# Patient Record
Sex: Female | Born: 1950 | Race: White | Hispanic: No | Marital: Married | Smoking: Never smoker
Health system: Southern US, Community
[De-identification: ages and names within clinical notes are randomized; demographics above are authoritative.]

## PROBLEM LIST (undated history)

## (undated) DIAGNOSIS — K219 Gastro-esophageal reflux disease without esophagitis: Secondary | ICD-10-CM

## (undated) DIAGNOSIS — J42 Unspecified chronic bronchitis: Secondary | ICD-10-CM

## (undated) HISTORY — PX: ABDOMINAL HYSTERECTOMY: SHX81

---

## 2016-05-11 ENCOUNTER — Inpatient Hospital Stay (HOSPITAL_BASED_OUTPATIENT_CLINIC_OR_DEPARTMENT_OTHER)
Admission: EM | Admit: 2016-05-11 | Discharge: 2016-05-12 | DRG: 378 | Disposition: A | Discharge status: Home / Self Care | Payer: Medicare HMO | Attending: Internal Medicine | Admitting: Internal Medicine

## 2016-05-11 ENCOUNTER — Encounter (HOSPITAL_BASED_OUTPATIENT_CLINIC_OR_DEPARTMENT_OTHER): Payer: Self-pay | Admitting: *Deleted

## 2016-05-11 ENCOUNTER — Emergency Department (HOSPITAL_BASED_OUTPATIENT_CLINIC_OR_DEPARTMENT_OTHER): Payer: Medicare HMO

## 2016-05-11 DIAGNOSIS — K219 Gastro-esophageal reflux disease without esophagitis: Secondary | ICD-10-CM | POA: Diagnosis present

## 2016-05-11 DIAGNOSIS — K921 Melena: Secondary | ICD-10-CM

## 2016-05-11 DIAGNOSIS — K2970 Gastritis, unspecified, without bleeding: Secondary | ICD-10-CM | POA: Diagnosis present

## 2016-05-11 DIAGNOSIS — Z90710 Acquired absence of both cervix and uterus: Secondary | ICD-10-CM | POA: Diagnosis not present

## 2016-05-11 DIAGNOSIS — Z806 Family history of leukemia: Secondary | ICD-10-CM | POA: Diagnosis not present

## 2016-05-11 DIAGNOSIS — E538 Deficiency of other specified B group vitamins: Secondary | ICD-10-CM

## 2016-05-11 DIAGNOSIS — R1310 Dysphagia, unspecified: Secondary | ICD-10-CM | POA: Diagnosis present

## 2016-05-11 DIAGNOSIS — K259 Gastric ulcer, unspecified as acute or chronic, without hemorrhage or perforation: Secondary | ICD-10-CM | POA: Diagnosis present

## 2016-05-11 DIAGNOSIS — K210 Gastro-esophageal reflux disease with esophagitis: Secondary | ICD-10-CM | POA: Diagnosis present

## 2016-05-11 DIAGNOSIS — D62 Acute posthemorrhagic anemia: Secondary | ICD-10-CM | POA: Diagnosis present

## 2016-05-11 DIAGNOSIS — J42 Unspecified chronic bronchitis: Secondary | ICD-10-CM | POA: Diagnosis present

## 2016-05-11 DIAGNOSIS — D539 Nutritional anemia, unspecified: Secondary | ICD-10-CM | POA: Diagnosis present

## 2016-05-11 DIAGNOSIS — K208 Other esophagitis without bleeding: Secondary | ICD-10-CM

## 2016-05-11 DIAGNOSIS — Z8249 Family history of ischemic heart disease and other diseases of the circulatory system: Secondary | ICD-10-CM

## 2016-05-11 DIAGNOSIS — K449 Diaphragmatic hernia without obstruction or gangrene: Secondary | ICD-10-CM | POA: Diagnosis present

## 2016-05-11 HISTORY — DX: Gastro-esophageal reflux disease without esophagitis: K21.9

## 2016-05-11 HISTORY — DX: Unspecified chronic bronchitis: J42

## 2016-05-11 LAB — CBC WITH DIFFERENTIAL/PLATELET
BASOS PCT: 0 %
Basophils Absolute: 0 10*3/uL (ref 0.0–0.1)
EOS ABS: 0 10*3/uL (ref 0.0–0.7)
EOS PCT: 0 %
HCT: 20.3 % — ABNORMAL LOW (ref 36.0–46.0)
HEMOGLOBIN: 6.5 g/dL — AB (ref 12.0–15.0)
Lymphocytes Relative: 20 %
Lymphs Abs: 2.5 10*3/uL (ref 0.7–4.0)
MCH: 32.2 pg (ref 26.0–34.0)
MCHC: 32 g/dL (ref 30.0–36.0)
MCV: 100.5 fL — ABNORMAL HIGH (ref 78.0–100.0)
MYELOCYTES: 1 %
Metamyelocytes Relative: 1 %
Monocytes Absolute: 0 10*3/uL — ABNORMAL LOW (ref 0.1–1.0)
Monocytes Relative: 0 %
NEUTROS ABS: 10.1 10*3/uL — AB (ref 1.7–7.7)
NEUTROS PCT: 78 %
Platelets: 285 10*3/uL (ref 150–400)
RBC: 2.02 MIL/uL — ABNORMAL LOW (ref 3.87–5.11)
RDW: 14.7 % (ref 11.5–15.5)
WBC: 12.6 10*3/uL — ABNORMAL HIGH (ref 4.0–10.5)

## 2016-05-11 LAB — COMPREHENSIVE METABOLIC PANEL
ALK PHOS: 72 U/L (ref 38–126)
ALT: 13 U/L — AB (ref 14–54)
AST: 21 U/L (ref 15–41)
Albumin: 3.5 g/dL (ref 3.5–5.0)
Anion gap: 9 (ref 5–15)
BUN: 38 mg/dL — AB (ref 6–20)
CALCIUM: 8.7 mg/dL — AB (ref 8.9–10.3)
CO2: 22 mmol/L (ref 22–32)
CREATININE: 0.83 mg/dL (ref 0.44–1.00)
Chloride: 106 mmol/L (ref 101–111)
GFR calc non Af Amer: >60 mL/min (ref >60)
GLUCOSE: 135 mg/dL — AB (ref 65–99)
Potassium: 3.6 mmol/L (ref 3.5–5.1)
SODIUM: 137 mmol/L (ref 135–145)
Total Bilirubin: 0.3 mg/dL (ref 0.3–1.2)
Total Protein: 6.6 g/dL (ref 6.5–8.1)

## 2016-05-11 LAB — RETICULOCYTES
RBC.: 1.79 MIL/uL — AB (ref 3.87–5.11)
RETIC COUNT ABSOLUTE: 139.6 10*3/uL (ref 19.0–186.0)
Retic Ct Pct: 7.8 % — ABNORMAL HIGH (ref 0.4–3.1)

## 2016-05-11 LAB — PROTIME-INR
INR: 1.08
Prothrombin Time: 14 seconds (ref 11.4–15.2)

## 2016-05-11 LAB — FOLATE: FOLATE: 14.3 ng/mL (ref >5.9)

## 2016-05-11 LAB — HEMOGLOBIN AND HEMATOCRIT, BLOOD
HCT: 19 % — ABNORMAL LOW (ref 36.0–46.0)
Hemoglobin: 6.3 g/dL — CL (ref 12.0–15.0)

## 2016-05-11 LAB — IRON AND TIBC
IRON: 29 ug/dL (ref 28–170)
Saturation Ratios: 11 % (ref 10.4–31.8)
TIBC: 276 ug/dL (ref 250–450)
UIBC: 247 ug/dL

## 2016-05-11 LAB — FERRITIN: Ferritin: 20 ng/mL (ref 11–307)

## 2016-05-11 LAB — TROPONIN I: Troponin I: <0.03 ng/mL (ref <0.03)

## 2016-05-11 LAB — MRSA PCR SCREENING: MRSA by PCR: NEGATIVE

## 2016-05-11 LAB — ABO/RH: ABO/RH(D): O NEG

## 2016-05-11 LAB — PREPARE RBC (CROSSMATCH)

## 2016-05-11 LAB — VITAMIN B12: Vitamin B-12: 163 pg/mL — ABNORMAL LOW (ref 180–914)

## 2016-05-11 LAB — OCCULT BLOOD X 1 CARD TO LAB, STOOL: Fecal Occult Bld: POSITIVE — AB

## 2016-05-11 MED ORDER — PANTOPRAZOLE SODIUM 40 MG IV SOLR
INTRAVENOUS | Status: AC
  Filled 2016-05-11: qty 80

## 2016-05-11 MED ORDER — ACETAMINOPHEN 650 MG RE SUPP: 650.0000 mg | Freq: Four times a day (QID) | RECTAL | Status: DC | PRN

## 2016-05-11 MED ORDER — ONDANSETRON HCL 4 MG PO TABS: 4.0000 mg | ORAL_TABLET | Freq: Four times a day (QID) | ORAL | Status: DC | PRN

## 2016-05-11 MED ORDER — SODIUM CHLORIDE 0.9 % IV SOLN
Freq: Once | INTRAVENOUS | Status: AC
  Administered 2016-05-11: 17:00:00 via INTRAVENOUS

## 2016-05-11 MED ORDER — SODIUM CHLORIDE 0.9 % IV BOLUS (SEPSIS)
1000.0000 mL | Freq: Once | INTRAVENOUS | Status: AC
  Administered 2016-05-11: 1000 mL via INTRAVENOUS

## 2016-05-11 MED ORDER — SODIUM CHLORIDE 0.9 % IV SOLN
8.0000 mg/h | INTRAVENOUS | Status: DC
  Administered 2016-05-11: 8 mg/h via INTRAVENOUS
  Filled 2016-05-11 (×4): qty 80

## 2016-05-11 MED ORDER — ONDANSETRON HCL 4 MG/2ML IJ SOLN: 4.0000 mg | Freq: Four times a day (QID) | INTRAMUSCULAR | Status: DC | PRN

## 2016-05-11 MED ORDER — SODIUM CHLORIDE 0.9 % IV SOLN
80.0000 mg | Freq: Once | INTRAVENOUS | Status: AC
  Administered 2016-05-11: 80 mg via INTRAVENOUS
  Filled 2016-05-11: qty 80

## 2016-05-11 MED ORDER — SODIUM CHLORIDE 0.9 % IV SOLN
INTRAVENOUS | Status: DC
  Administered 2016-05-11 – 2016-05-12 (×2): via INTRAVENOUS

## 2016-05-11 MED ORDER — FLUTICASONE PROPIONATE 50 MCG/ACT NA SUSP
1.0000 | Freq: Every day | NASAL | Status: DC
  Administered 2016-05-11: 1 via NASAL
  Filled 2016-05-11: qty 16

## 2016-05-11 MED ORDER — SODIUM CHLORIDE 0.9% FLUSH
3.0000 mL | Freq: Two times a day (BID) | INTRAVENOUS | Status: DC
  Administered 2016-05-11 – 2016-05-12 (×2): 3 mL via INTRAVENOUS

## 2016-05-11 MED ORDER — ACETAMINOPHEN 325 MG PO TABS: 650.0000 mg | ORAL_TABLET | Freq: Four times a day (QID) | ORAL | Status: DC | PRN

## 2016-05-11 MED ORDER — LORATADINE 10 MG PO TABS
10.0000 mg | ORAL_TABLET | Freq: Every day | ORAL | Status: DC
  Filled 2016-05-11: qty 1

## 2016-05-11 NOTE — ED Triage Notes (Signed)
She noticed her stools were black after starting  Augmentin on Thursday for treatment of bronchitis. She is pale, dizzy, and weak. She fell due to dizziness this am.

## 2016-05-11 NOTE — ED Notes (Signed)
[facility] here to transport pt to [facility]. Care turned over to transport team.

## 2016-05-11 NOTE — Plan of Care (Signed)
Called by Dr Silverio LayYao [facility] presented with melanotic stools. Patient had started Augmentin for bronchitis and subsequently noticed melanotic stools. Today felt dizzy and near syncopal. Hemoglobin 6.5.  Dr Silverio LayYao [facility]. Accepted to stepdown unit.   Carmen Williams M.D. [facility] 05/11/2016, 1:26 PM  Pager: [phone]

## 2016-05-11 NOTE — ED Notes (Signed)
Dr Silverio LayYao aware of orthostatic VS.

## 2016-05-11 NOTE — ED Provider Notes (Addendum)
[facility] Provider Note   CSN: [id number] Arrival date & time: 05/11/16  1041     History   Chief Complaint Chief Complaint  Patient presents with  . GI Bleeding    HPI Carmen Williams is a 65 y.o. female history of hysterectomy here presenting with cough, melena. Patient has been coughing for the last 10 days or so. Patient was initially prescribed a Z-Pak but it didn't help and she was still coughing so was prescribed a course of Augmentin 5 days ago. Patient states that the coughing has improved but for the last 4-5 days, she noticed black stools. Denies any vomiting or abdominal pain. Today she felt lightheaded and dizzy and almost passed out in the shower. Patient denies chest pain or shortness of breath. Not on blood thinners and no hx of GI bleed.   The history is provided by the patient.    History reviewed. No pertinent past medical history.  There are no active problems to display for this patient.   Past Surgical History:  Procedure Laterality Date  . ABDOMINAL HYSTERECTOMY      OB History    No data available       Home Medications    Prior to Admission medications   Not on File    Family History No family history on file.  Social History Social History  Substance Use Topics  . Smoking status: Never Smoker  . Smokeless tobacco: Never Used  . Alcohol use No     Allergies   Patient has no known allergies.   Review of Systems Review of Systems  Gastrointestinal: Positive for blood in stool.  Neurological: Positive for dizziness.  All other systems reviewed and are negative.    Physical Exam Updated Vital Signs BP 141/86   Pulse 91   Temp 98.6 F (37 C) (Oral)   Resp 24   Ht 5' (1.524 m)   Wt 179 lb (81.2 kg)   SpO2 99%   BMI 34.96 kg/m   Physical Exam  Constitutional:  Pale   HENT:  Head: Normocephalic.  Eyes: EOM are normal. Pupils are equal, round, and reactive to light.  Conjunctiva pale   Neck: Normal range  of motion.  Cardiovascular: Regular rhythm and normal heart sounds.   Tachycardic   Pulmonary/Chest: Effort normal and breath sounds normal. No respiratory distress. She has no wheezes. She has no rales.  Abdominal: Soft. Bowel sounds are normal. She exhibits no distension. There is no tenderness. There is no guarding.  Genitourinary:  Genitourinary Comments: + melena. No obvious hemorrhoids   Musculoskeletal: Normal range of motion.  Neurological: She is alert.  Skin: Skin is warm.  Psychiatric: She has a normal mood and affect.  Nursing note and vitals reviewed.    ED Treatments / Results  Labs (all labs ordered are listed, but only abnormal results are displayed) Labs Reviewed  CBC WITH DIFFERENTIAL/PLATELET - Abnormal; Notable for the following:       Result Value   WBC 12.6 (*)    RBC 2.02 (*)    Hemoglobin 6.5 (*)    HCT 20.3 (*)    MCV 100.5 (*)    Neutro Abs 10.1 (*)    Monocytes Absolute 0.0 (*)    All other components within normal limits  COMPREHENSIVE METABOLIC PANEL - Abnormal; Notable for the following:    Glucose, Bld 135 (*)    BUN 38 (*)    Calcium 8.7 (*)    ALT  13 (*)    All other components within normal limits  OCCULT BLOOD X 1 CARD TO LAB, STOOL - Abnormal; Notable for the following:    Fecal Occult Bld POSITIVE (*)    All other components within normal limits  PROTIME-INR  TROPONIN I    EKG  EKG Interpretation  Date/Time:  Monday May 11 2016 11:26:47 EST Ventricular Rate:  93 PR Interval:    QRS Duration: 86 QT Interval:  366 QTC Calculation: 456 R Axis:   -10 Text Interpretation:  Sinus rhythm Abnormal R-wave progression, early transition Baseline wander in lead(s) I II III aVR aVL aVF V1 V5 No previous ECGs available Confirmed by Adalie Mand  MD, Gearline Spilman ([id number]) on 05/11/2016 11:53:59 AM       Radiology Dg Chest 2 View  Result Date: 05/11/2016 CLINICAL DATA:  Dry cough for a week. EXAM: CHEST  2 VIEW COMPARISON:  None. FINDINGS: The  heart size and mediastinal contours are within normal limits. Both lungs are clear. The visualized skeletal structures are unremarkable. IMPRESSION: No active cardiopulmonary disease. Electronically Signed   By: Charlett NoseKevin  Dover M.D.   On: 05/11/2016 11:53    Procedures Procedures (including critical care time)  CRITICAL CARE Performed by: Richardean Canalavid H Jolan Upchurch   Total critical care time:30 minutes  Critical care time was exclusive of separately billable procedures and treating other patients.  Critical care was necessary to treat or prevent imminent or life-threatening deterioration.  Critical care was time spent personally by me on the following activities: development of treatment plan with patient and/or surrogate as well as nursing, discussions with consultants, evaluation of patient's response to treatment, examination of patient, obtaining history from patient or surrogate, ordering and performing treatments and interventions, ordering and review of laboratory studies, ordering and review of radiographic studies, pulse oximetry and re-evaluation of patient's condition.   Medications Ordered in ED Medications  pantoprazole (PROTONIX) 80 mg in sodium chloride 0.9 % 250 mL (0.32 mg/mL) infusion (not administered)  pantoprazole (PROTONIX) 40 MG injection (not administered)  pantoprazole (PROTONIX) 40 MG injection (not administered)  sodium chloride 0.9 % bolus 1,000 mL (0 mLs Intravenous Stopped 05/11/16 1220)  pantoprazole (PROTONIX) 80 mg in sodium chloride 0.9 % 100 mL IVPB (80 mg Intravenous New Bag/Given 05/11/16 1242)     Initial Impression / Assessment and Plan / ED Course  I have reviewed the triage vital signs and the nursing notes.  Pertinent labs & imaging results that were available during my care of the patient were reviewed by me and considered in my medical decision making (see chart for details).  Clinical Course    Carmen Williams is a 65 y.o. female here with near syncope from  melena. Has been coughing but no fevers. Patient orthostatic and dropped BP to 70s. No hx of GI bleed and not on blood thinners. Will check labs, coag. Will likely start protonix and give IVF. Has no previous colonoscopy in the past.   12: 30 pm Patient's Hg 6.5. Occ positive. Started on protonix bolus and drip. Requested [facility]. Called Dr. Wynona MealsSowmya but there are no monitored beds. [facility]. Patient will need transfusion but we only have O neg blood here. Will not need emergent transfusion but will need one after transfer.   1:24 PM [facility]. Patient not on blood thinners and hasn't been taking ASA or NSAIDs.   1:32  PM Talked to Willette ClusterPaula Guenther from [facility]. They will see patient as Research scientist (medical)consultant. [facility]  Final Clinical Impressions(s) / ED Diagnoses   Final diagnoses:  None    New Prescriptions New Prescriptions   No medications on file     Charlynne Panderavid Hsienta Corena Tilson, MD 05/11/16 1332    Charlynne Panderavid Hsienta Altheia Shafran, MD 05/11/16 1335

## 2016-05-11 NOTE — ED Notes (Signed)
Hospitalist, has not returned call--20 min.--- has been paged a second time-  Via

## 2016-05-11 NOTE — Consult Note (Signed)
 Referring Provider: [facility] -Dr. Rai Primary Care Physician:  HICKS, KRISTIN D, MD Primary Gastroenterologist:   None  Reason for Consultation:  GI bleed  HPI: Carmen Williams is a 65 y.o. female , relatively healthy, transferred here today from [facility] with anemia / GIB. Recently took a course of Zpak for bronchitis. For persistent symptoms she was prescribed Amoxicillin and took her first dose on Thursday. Followed first dose of Amoxicillin patient had a black BM. Stool was solid but the following couple of days stools were a little loose. She has continued to have black stools since Thursday, last one was this am. No abdominal pain, no nausea. Patient takes no NSAIDS, no blood thinners. She has no history of PUD. Her only GI symptoms are GERD and dysphagia. Over the last few years patient has had intermittent heartburn at night. Now just makes a habit of taking a rolaid or tums before bed. As far as the dysphagia, food sometimes gets stuck in her esophagus. She has to induce vomiting to get the undigested food up. Her weight is stable. Recently did what sounds like an IFOB through her PCP. Patient had declined referral for a colonoscopy. Results not yet called to her..    Past Medical History:  Diagnosis Date  . Chronic bronchitis (HCC)   . GERD (gastroesophageal reflux disease)     Past Surgical History:  Procedure Laterality Date  . ABDOMINAL HYSTERECTOMY      Home medications  Amoxicillin Tums or Rolaids -[facility]  Current Facility-Administered Medications  Medication Dose Route Frequency Provider Last Rate Last Dose  . 0.9 %  sodium chloride infusion   Intravenous Continuous Ripudeep K Rai, MD      . 0.9 %  sodium chloride infusion   Intravenous Once Ripudeep K Rai, MD      . acetaminophen (TYLENOL) tablet 650 mg  650 mg Oral Q6H PRN Ripudeep K Rai, MD       Or  . acetaminophen (TYLENOL) suppository 650 mg  650 mg Rectal Q6H PRN Ripudeep K Rai, MD      .  ondansetron (ZOFRAN) tablet 4 mg  4 mg Oral Q6H PRN Ripudeep K Rai, MD       Or  . ondansetron (ZOFRAN) injection 4 mg  4 mg Intravenous Q6H PRN Ripudeep K Rai, MD      . pantoprazole (PROTONIX) 40 MG injection           . pantoprazole (PROTONIX) 40 MG injection           . pantoprazole (PROTONIX) 80 mg in sodium chloride 0.9 % 250 mL (0.32 mg/mL) infusion  8 mg/hr Intravenous Continuous David Hsienta Yao, MD 25 mL/hr at 05/11/16 1318 8 mg/hr at 05/11/16 1318  . sodium chloride flush (NS) 0.9 % injection 3 mL  3 mL Intravenous Q12H Ripudeep K Rai, MD        Allergies as of 05/11/2016  . (No Known Allergies)    Family History  Problem Relation Age of Onset  . Leukemia Mother   . Heart attack Father   . Heart Problems Brother     Social History   Social History  . Marital status: Married    Spouse name: N/A  . Number of children: N/A  . Years of education: N/A    Social History Main Topics  . Smoking status: Never Smoker  . Smokeless tobacco: Never Used  . Alcohol use No  . Drug use: No  .   Sexual activity: Not Currently    Review of Systems: Positive for cough, occas prod of green sputum. Dizziness with standing. All other systems reviewed and negative except where noted in HPI.  Physical Exam: Vital signs in last 24 hours: Temp:  [98 F (36.7 C)-98.6 F (37 C)] 98 F (36.7 C) (11/20 1500) Pulse Rate:  [80-118] 82 (11/20 1500) Resp:  [14-24] 17 (11/20 1500) BP: (99-160)/(68-86) 160/68 (11/20 1500) SpO2:  [99 %-100 %] 100 % (11/20 1500) Weight:  [173 lb 11.6 oz (78.8 kg)-179 lb (81.2 kg)] 173 lb 11.6 oz (78.8 kg) (11/20 1500) Last BM Date: 05/11/16 General:   Alert,  well-developed, female in NAD Psych:  Pleasant, great eye contact.  Head:  Normocephalic and atraumatic. Eyes:  Sclera clear, no icterus.   Conjunctiva pale Ears:  Normal auditory acuity. Nose:  No deformity, discharge,  or lesions. Mouth:  No deformity or lesions.   Neck:  Supple; no masses or  thyromegaly. Lungs:  Decreased breath sounds at bases L>R. No wheezes. Respirations even and unlabored.  Heart:  Regular rate and rhythm; no murmurs, clicks, rubs,  or gallops. Abdomen:  Soft,nontender, BS active,nonpalp mass or hsm.   Rectal:  Deferred  Msk:  Symmetrical without gross deformities. . Pulses:  Normal pulses noted. Extremities:  Without clubbing or edema. Bruising RLE (side of shin) Neurologic:  Alert and  oriented x4;  grossly normal neurologically. Skin:  Intact without significant lesions or rashes.. Psych:  Alert and cooperative. Normal mood and affect.  Intake/Output from previous day: No intake/output data recorded. Intake/Output this shift: Total I/O In: 142.5 [I.V.:42.5; IV Piggyback:100] Out: -   Lab Results:  Recent Labs  05/11/16 1122  WBC 12.6*  HGB 6.5*  HCT 20.3*  PLT 285   BMET  Recent Labs  05/11/16 1122  NA 137  K 3.6  CL 106  CO2 22  GLUCOSE 135*  BUN 38*  CREATININE 0.83  CALCIUM 8.7*   LFT  Recent Labs  05/11/16 1122  PROT 6.6  ALBUMIN 3.5  AST 21  ALT 13*  ALKPHOS 72  BILITOT 0.3   PT/INR  Recent Labs  05/11/16 1122  LABPROT 14.0  INR 1.08    Studies/Results: Dg Chest 2 View  Result Date: 05/11/2016 CLINICAL DATA:  Dry cough for a week. EXAM: CHEST  2 VIEW COMPARISON:  None. FINDINGS: The heart size and mediastinal contours are within normal limits. Both lungs are clear. The visualized skeletal structures are unremarkable. IMPRESSION: No active cardiopulmonary disease. Electronically Signed   By: Charlett NoseKevin  Dover M.D.   On: 05/11/2016 11:53    IMPRESSION / PLAN:   1. 65 yo female hemodynamically unstable GIB, presumed upper source with melena, elevated BUN. BP has stabilized but still symptomatic when standing. No NSAID use, no abdominal pain or nausea. Dieulafoy? PUD? Neoplasm?  -Plan is for am EGD.The risks and benefits of EGD were discussed and the patient agrees to proceed. Coags are normal.  -clear liquid  only, npo after midnight -continue PPI gtt -serial CBC -If EGD negative then will likely need colonoscopy the following day   2. Severe symptomatic anemia, macrocytic. Hgb 6.5, baseline unknown.  -B12, folate -2 units of blood already ordered by Hospitalist  3. GERD / symptomatic mainly at night, Tums help.   4. Intermittent solid food dysphagia. Can assess for strictures at time of EGD. If stricture then can do elective EGD with dilation as outpatient.     Willette Clusteraula Guenther  05/11/2016, 4:01 PM  Pager number [phone]

## 2016-05-11 NOTE — H&P (Signed)
History and Physical        Hospital Admission Note Date: 05/11/2016  Patient name: Carmen Williams Medical record number: [id number] Date of birth: 03/30/1951 Age: 65 y.o. Gender: female  PCP: Agustina CaroliHICKS, KRISTIN D, MD   Referring physician: Dr Silverio LayYao  [facility]   Chief Complaint:  Dark tarry stools  HPI: Patient is a 65 year old female with history of GERD, chronic bronchitis presented to [facility] ED with dark tarry stools since Thursday 5 days ago. Patient reported that she was having bronchitis with chest congestion, she took a Z-Pak for one week, finished on Wednesday, 11/15 with no significant improvement. She started Augmentin on 11/16 on Thursday and on the same day started having dark stools. No nausea, vomiting, abdominal pain. Since then she's been having melanotic stools. She has been taking Augmentin 875mg  BID dose. This morning she felt pale, dizzy lightheaded and weak. No chest pain or shortness of breath. Patient denies any aspirin use. She denies any NSAID use or any Goody powders use. She has a history of GERD and takes Tums every night. ED work-up/course:  BMET unremarkable CBC showed a WBC count 12.6, hemoglobin 6.5, hematocrit 20.3, MCV 100.5. FOBT +  Review of Systems: Positives marked in 'bold' Constitutional: Denies fever, chills, diaphoresis, poor appetite and + fatigue.  HEENT: Denies photophobia, eye pain, redness, hearing loss, ear pain, congestion, sore throat, rhinorrhea, sneezing, mouth sores, trouble swallowing, neck pain, neck stiffness and tinnitus.   Respiratory: Denies SOB, DOE, cough, chest tightness,  and wheezing.   Cardiovascular: Denies chest pain, palpitations and leg swelling.  Gastrointestinal: Denies nausea, vomiting, abdominal pain, diarrhea, constipation, blood in stool and no abdominal distention.  Genitourinary:  Denies dysuria, urgency, frequency, hematuria, flank pain and difficulty urinating.  Musculoskeletal: Denies myalgias, back pain, joint swelling, arthralgias and gait problem.  Skin: Denies pallor, rash and wound.  Neurological: Denies dizziness, seizures, syncope, weakness, light-headedness, numbness and headaches.  Hematological: Denies adenopathy. Easy bruising, personal or family bleeding history  Psychiatric/Behavioral: Denies suicidal ideation, mood changes, confusion, nervousness, sleep disturbance and agitation  Past Medical History: Past Medical History:  Diagnosis Date  . Chronic bronchitis (HCC)   . GERD (gastroesophageal reflux disease)     Past Surgical History:  Procedure Laterality Date  . ABDOMINAL HYSTERECTOMY      Medications: Prior to Admission medications   Not on File   Tums every night Allergies:  No Known Allergies  Social History:  reports that she has never smoked. She has never used smokeless tobacco. She reports that she does not drink alcohol or use drugs.  Family History: Family History  Problem Relation Age of Onset  . Leukemia Mother   . Heart attack Father   . Heart Problems Brother     Physical Exam: Blood pressure (!) 160/68, pulse 82, temperature 98 F (36.7 C), temperature source Oral, resp. rate 17, height 5' (1.524 m), weight 78.8 kg (173 lb 11.6 oz), SpO2 100 %. General: Alert, awake, oriented x3, in no acute distress. HEENT: normocephalic, atraumatic, anicteric sclera, Pale conjunctiva, pupils equal and reactive to light and accomodation, oropharynx clear Neck: supple, no masses or lymphadenopathy, no goiter,  no bruits  Heart: Regular rate and rhythm, without murmurs, rubs or gallops. Lungs: Clear to auscultation bilaterally, no wheezing, rales or rhonchi. Abdomen: Soft, nontender, nondistended, positive bowel sounds, no masses. Extremities: No clubbing, cyanosis or edema with positive pedal pulses. Neuro: Grossly intact, no  focal neurological deficits, strength 5/5 upper and lower extremities bilaterally Psych: alert and oriented x 3, normal mood and affect Skin: no rashes or lesions, warm and dry   LABS on Admission:  Basic Metabolic Panel:  Recent Labs Lab 05/11/16 1122  NA 137  K 3.6  CL 106  CO2 22  GLUCOSE 135*  BUN 38*  CREATININE 0.83  CALCIUM 8.7*   Liver Function Tests:  Recent Labs Lab 05/11/16 1122  AST 21  ALT 13*  ALKPHOS 72  BILITOT 0.3  PROT 6.6  ALBUMIN 3.5   No results for input(s): LIPASE, AMYLASE in the last 168 hours. No results for input(s): AMMONIA in the last 168 hours. CBC:  Recent Labs Lab 05/11/16 1122  WBC 12.6*  NEUTROABS 10.1*  HGB 6.5*  HCT 20.3*  MCV 100.5*  PLT 285   Cardiac Enzymes:  Recent Labs Lab 05/11/16 1122  TROPONINI <0.03   BNP: Invalid input(s): POCBNP CBG: No results for input(s): GLUCAP in the last 168 hours.  Radiological Exams on Admission:  No results found.  *I have personally reviewed the images above*    Assessment/Plan Principal Problem:   GI bleed in the setting of GERD and antibiotic use, acute blood loss anemia with a hemoglobin of 6.5 - Admitted to stepdown unit, serial H&H, IV fluids, type and cross, anemia panel - Transfuse for Hb >8, GI consulted - Placed on PPI drip  Active Problems:   GERD (gastroesophageal reflux disease) - Placed on PPI drip    bronchitis (HCC) - Placed on Flonase spray - Currently no wheezing, stable  DVT prophylaxis: SCDs  CODE STATUS: Full CODE STATUS  Consults called: GI  Family Communication: Admission, patients condition and plan of care including tests being ordered have been discussed with the patient and husband ...Marland Kitchen.Marland Kitchen.Marland Kitchen. who indicates understanding and agree with the plan and Code Status  Admission status: SDU, inpt  Disposition plan: Further plan will depend as patient's clinical course evolves and further radiologic and laboratory data become available. Likely  home  At the time of admission, it appears that the appropriate admission status for this patient is INPATIENT . This is judged to be reasonable and necessary in order to provide the required intensity of service to ensure the patient's safety given the presenting symptoms, physical exam findings, and initial radiographic and laboratory data in the context of their chronic comorbidities.     Time Spent on Admission: 60mins   Edu On M.D. [facility] 05/11/2016, 3:46 PM Pager: [phone]  If 7PM-7AM, please contact night-coverage [email or web address] Password [email or web address]

## 2016-05-11 NOTE — ED Notes (Signed)
[facility]----Dr. Conley RollsLe, direct---will call Dr. Silverio LayYao back on [phone].

## 2016-05-12 ENCOUNTER — Encounter (HOSPITAL_COMMUNITY): Payer: Self-pay

## 2016-05-12 ENCOUNTER — Encounter (HOSPITAL_COMMUNITY)
Admission: EM | Disposition: A | Discharge status: Home / Self Care | Payer: Self-pay | Source: Home / Self Care | Attending: Internal Medicine

## 2016-05-12 ENCOUNTER — Telehealth: Payer: Self-pay

## 2016-05-12 ENCOUNTER — Inpatient Hospital Stay (HOSPITAL_COMMUNITY): Payer: Medicare HMO | Admitting: Anesthesiology

## 2016-05-12 DIAGNOSIS — K208 Other esophagitis without bleeding: Secondary | ICD-10-CM

## 2016-05-12 DIAGNOSIS — K921 Melena: Principal | ICD-10-CM

## 2016-05-12 DIAGNOSIS — E538 Deficiency of other specified B group vitamins: Secondary | ICD-10-CM

## 2016-05-12 DIAGNOSIS — K259 Gastric ulcer, unspecified as acute or chronic, without hemorrhage or perforation: Secondary | ICD-10-CM

## 2016-05-12 HISTORY — PX: ESOPHAGOGASTRODUODENOSCOPY (EGD) WITH PROPOFOL: SHX5813

## 2016-05-12 LAB — VITAMIN B12: VITAMIN B 12: 165 pg/mL — AB (ref 180–914)

## 2016-05-12 LAB — BASIC METABOLIC PANEL
ANION GAP: 3 — AB (ref 5–15)
BUN: 16 mg/dL (ref 6–20)
CALCIUM: 7.8 mg/dL — AB (ref 8.9–10.3)
CO2: 23 mmol/L (ref 22–32)
Chloride: 113 mmol/L — ABNORMAL HIGH (ref 101–111)
Creatinine, Ser: 0.75 mg/dL (ref 0.44–1.00)
Glucose, Bld: 94 mg/dL (ref 65–99)
Potassium: 3.6 mmol/L (ref 3.5–5.1)
SODIUM: 139 mmol/L (ref 135–145)

## 2016-05-12 LAB — HEMOGLOBIN AND HEMATOCRIT, BLOOD
HCT: 23 % — ABNORMAL LOW (ref 36.0–46.0)
HEMATOCRIT: 24.6 % — AB (ref 36.0–46.0)
HEMOGLOBIN: 7.7 g/dL — AB (ref 12.0–15.0)
HEMOGLOBIN: 8 g/dL — AB (ref 12.0–15.0)

## 2016-05-12 SURGERY — CANCELLED PROCEDURE

## 2016-05-12 SURGERY — ESOPHAGOGASTRODUODENOSCOPY (EGD) WITH PROPOFOL
Anesthesia: Monitor Anesthesia Care

## 2016-05-12 MED ORDER — ONDANSETRON HCL 4 MG/2ML IJ SOLN
INTRAMUSCULAR | Status: DC | PRN
  Administered 2016-05-12: 4 mg via INTRAVENOUS

## 2016-05-12 MED ORDER — FERROUS SULFATE 325 (65 FE) MG PO TABS: 325.0000 mg | ORAL_TABLET | Freq: Every day | ORAL | 0 refills | Status: AC

## 2016-05-12 MED ORDER — PANTOPRAZOLE SODIUM 40 MG PO TBEC
40.0000 mg | DELAYED_RELEASE_TABLET | Freq: Two times a day (BID) | ORAL | Status: DC
  Administered 2016-05-12: 40 mg via ORAL
  Filled 2016-05-12: qty 1

## 2016-05-12 MED ORDER — GLYCOPYRROLATE 0.2 MG/ML IJ SOLN
INTRAMUSCULAR | Status: DC | PRN
  Administered 2016-05-12: 0.2 mg via INTRAVENOUS

## 2016-05-12 MED ORDER — SODIUM CHLORIDE 0.9 % IV SOLN
510.0000 mg | Freq: Once | INTRAVENOUS | Status: DC
  Filled 2016-05-12: qty 17

## 2016-05-12 MED ORDER — PANTOPRAZOLE SODIUM 40 MG PO TBEC: 40.0000 mg | DELAYED_RELEASE_TABLET | Freq: Two times a day (BID) | ORAL | 0 refills | Status: AC

## 2016-05-12 MED ORDER — PHENYLEPHRINE HCL 10 MG/ML IJ SOLN
INTRAMUSCULAR | Status: DC | PRN
  Administered 2016-05-12 (×2): 160 ug via INTRAVENOUS

## 2016-05-12 MED ORDER — CYANOCOBALAMIN 1000 MCG/ML IJ SOLN
1000.0000 ug | Freq: Once | INTRAMUSCULAR | Status: DC
  Filled 2016-05-12: qty 1

## 2016-05-12 MED ORDER — PROPOFOL 500 MG/50ML IV EMUL
INTRAVENOUS | Status: DC | PRN
  Administered 2016-05-12: 300 ug/kg/min via INTRAVENOUS

## 2016-05-12 MED ORDER — LIDOCAINE HCL (CARDIAC) 20 MG/ML IV SOLN
INTRAVENOUS | Status: DC | PRN
  Administered 2016-05-12: 100 mg via INTRAVENOUS
  Administered 2016-05-12: 50 mg via INTRAVENOUS

## 2016-05-12 MED ORDER — PROPOFOL 10 MG/ML IV BOLUS
INTRAVENOUS | Status: AC
  Filled 2016-05-12: qty 20

## 2016-05-12 MED ORDER — PROPOFOL 10 MG/ML IV BOLUS
INTRAVENOUS | Status: AC
  Filled 2016-05-12: qty 60

## 2016-05-12 SURGICAL SUPPLY — 14 items

## 2016-05-12 NOTE — Telephone Encounter (Signed)
  Message Contents  Ruffin FrederickSteven Paul Armbruster, MD  Leverne HumblesJulia M Fournier, RN        Raynelle FanningJulie this patient is being discharged today. She will need follow up with me in a few months. Thanks       Recall put in system to schedule patient in February 2018.

## 2016-05-12 NOTE — Care Management Note (Signed)
Case Management Note  Patient Details  Name: Carmen Williams MRN: [id number] Date of Birth: 08/13/1950  Subjective/Objective:       Gi bleed with hgb of 6.5 and bld transfusions             Action/Plan:Date:  May 12, 2016 Chart reviewed for concurrent status and case management needs. Will continue to follow patient progress. Discharge Planning: following for needs Expected discharge date: [id number] Marcelle SmilingRhonda Mosi Hannold, BSN, [place], [place]   [phone]   Expected Discharge Date:   (unknown)               Expected Discharge Plan:  Home/Self Care  In-House Referral:     Discharge planning Services     Post Acute Care Choice:    Choice offered to:     DME Arranged:    DME Agency:     HH Arranged:    HH Agency:     Status of Service:  In process, will continue to follow  If discussed at Long Length of Stay Meetings, dates discussed:    Additional Comments:  [name], [name], RN 05/12/2016, 11:23 AM

## 2016-05-12 NOTE — H&P (View-Only) (Signed)
Referring Provider: [facility] -Dr. Isidoro Donningai Primary Care Physician:  Agustina CaroliHICKS, KRISTIN D, MD Primary Gastroenterologist:   None  Reason for Consultation:  GI bleed  HPI: Carmen Williams is a 65 y.o. female , relatively healthy, transferred here today from [facility] with anemia / GIB. Recently took a course of Zpak for bronchitis. For persistent symptoms she was prescribed Amoxicillin and took her first dose on Thursday. Followed first dose of Amoxicillin patient had a black BM. Stool was solid but the following couple of days stools were a little loose. She has continued to have black stools since Thursday, last one was this am. No abdominal pain, no nausea. Patient takes no NSAIDS, no blood thinners. She has no history of PUD. Her only GI symptoms are GERD and dysphagia. Over the last few years patient has had intermittent heartburn at night. Now just makes a habit of taking a rolaid or tums before bed. As far as the dysphagia, food sometimes gets stuck in her esophagus. She has to induce vomiting to get the undigested food up. Her weight is stable. Recently did what sounds like an IFOB through her PCP. Patient had declined referral for a colonoscopy. Results not yet called to her..    Past Medical History:  Diagnosis Date  . Chronic bronchitis (HCC)   . GERD (gastroesophageal reflux disease)     Past Surgical History:  Procedure Laterality Date  . ABDOMINAL HYSTERECTOMY      Home medications  Amoxicillin Tums or Rolaids -[facility]  Current Facility-Administered Medications  Medication Dose Route Frequency Provider Last Rate Last Dose  . 0.9 %  sodium chloride infusion   Intravenous Continuous Ripudeep K Rai, MD      . 0.9 %  sodium chloride infusion   Intravenous Once Ripudeep Jenna LuoK Rai, MD      . acetaminophen (TYLENOL) tablet 650 mg  650 mg Oral Q6H PRN Ripudeep Jenna LuoK Rai, MD       Or  . acetaminophen (TYLENOL) suppository 650 mg  650 mg Rectal Q6H PRN Ripudeep K Rai, MD      .  ondansetron (ZOFRAN) tablet 4 mg  4 mg Oral Q6H PRN Ripudeep K Rai, MD       Or  . ondansetron (ZOFRAN) injection 4 mg  4 mg Intravenous Q6H PRN Ripudeep K Rai, MD      . pantoprazole (PROTONIX) 40 MG injection           . pantoprazole (PROTONIX) 40 MG injection           . pantoprazole (PROTONIX) 80 mg in sodium chloride 0.9 % 250 mL (0.32 mg/mL) infusion  8 mg/hr Intravenous Continuous Charlynne Panderavid Hsienta Yao, MD 25 mL/hr at 05/11/16 1318 8 mg/hr at 05/11/16 1318  . sodium chloride flush (NS) 0.9 % injection 3 mL  3 mL Intravenous Q12H Ripudeep Jenna LuoK Rai, MD        Allergies as of 05/11/2016  . (No Known Allergies)    Family History  Problem Relation Age of Onset  . Leukemia Mother   . Heart attack Father   . Heart Problems Brother     Social History   Social History  . Marital status: Married    Spouse name: N/A  . Number of children: N/A  . Years of education: N/A    Social History Main Topics  . Smoking status: Never Smoker  . Smokeless tobacco: Never Used  . Alcohol use No  . Drug use: No  .  Sexual activity: Not Currently    Review of Systems: Positive for cough, occas prod of green sputum. Dizziness with standing. All other systems reviewed and negative except where noted in HPI.  Physical Exam: Vital signs in last 24 hours: Temp:  [98 F (36.7 C)-98.6 F (37 C)] 98 F (36.7 C) (11/20 1500) Pulse Rate:  [80-118] 82 (11/20 1500) Resp:  [14-24] 17 (11/20 1500) BP: (99-160)/(68-86) 160/68 (11/20 1500) SpO2:  [99 %-100 %] 100 % (11/20 1500) Weight:  [173 lb 11.6 oz (78.8 kg)-179 lb (81.2 kg)] 173 lb 11.6 oz (78.8 kg) (11/20 1500) Last BM Date: 05/11/16 General:   Alert,  well-developed, female in NAD Psych:  Pleasant, great eye contact.  Head:  Normocephalic and atraumatic. Eyes:  Sclera clear, no icterus.   Conjunctiva pale Ears:  Normal auditory acuity. Nose:  No deformity, discharge,  or lesions. Mouth:  No deformity or lesions.   Neck:  Supple; no masses or  thyromegaly. Lungs:  Decreased breath sounds at bases L>R. No wheezes. Respirations even and unlabored.  Heart:  Regular rate and rhythm; no murmurs, clicks, rubs,  or gallops. Abdomen:  Soft,nontender, BS active,nonpalp mass or hsm.   Rectal:  Deferred  Msk:  Symmetrical without gross deformities. . Pulses:  Normal pulses noted. Extremities:  Without clubbing or edema. Bruising RLE (side of shin) Neurologic:  Alert and  oriented x4;  grossly normal neurologically. Skin:  Intact without significant lesions or rashes.. Psych:  Alert and cooperative. Normal mood and affect.  Intake/Output from previous day: No intake/output data recorded. Intake/Output this shift: Total I/O In: 142.5 [I.V.:42.5; IV Piggyback:100] Out: -   Lab Results:  Recent Labs  05/11/16 1122  WBC 12.6*  HGB 6.5*  HCT 20.3*  PLT 285   BMET  Recent Labs  05/11/16 1122  NA 137  K 3.6  CL 106  CO2 22  GLUCOSE 135*  BUN 38*  CREATININE 0.83  CALCIUM 8.7*   LFT  Recent Labs  05/11/16 1122  PROT 6.6  ALBUMIN 3.5  AST 21  ALT 13*  ALKPHOS 72  BILITOT 0.3   PT/INR  Recent Labs  05/11/16 1122  LABPROT 14.0  INR 1.08    Studies/Results: Dg Chest 2 View  Result Date: 05/11/2016 CLINICAL DATA:  Dry cough for a week. EXAM: CHEST  2 VIEW COMPARISON:  None. FINDINGS: The heart size and mediastinal contours are within normal limits. Both lungs are clear. The visualized skeletal structures are unremarkable. IMPRESSION: No active cardiopulmonary disease. Electronically Signed   By: Charlett NoseKevin  Dover M.D.   On: 05/11/2016 11:53    IMPRESSION / PLAN:   1. 65 yo female hemodynamically unstable GIB, presumed upper source with melena, elevated BUN. BP has stabilized but still symptomatic when standing. No NSAID use, no abdominal pain or nausea. Dieulafoy? PUD? Neoplasm?  -Plan is for am EGD.The risks and benefits of EGD were discussed and the patient agrees to proceed. Coags are normal.  -clear liquid  only, npo after midnight -continue PPI gtt -serial CBC -If EGD negative then will likely need colonoscopy the following day   2. Severe symptomatic anemia, macrocytic. Hgb 6.5, baseline unknown.  -B12, folate -2 units of blood already ordered by Hospitalist  3. GERD / symptomatic mainly at night, Tums help.   4. Intermittent solid food dysphagia. Can assess for strictures at time of EGD. If stricture then can do elective EGD with dilation as outpatient.     Willette Clusteraula Guenther  05/11/2016, 4:01 PM  Pager number [phone]

## 2016-05-12 NOTE — Interval H&P Note (Signed)
History and Physical Interval Note:  05/12/2016 8:13 AM  Carmen Williams  has presented today for surgery, with the diagnosis of GI bleed  The various methods of treatment have been discussed with the patient and family. After consideration of risks, benefits and other options for treatment, the patient has consented to  Procedure(s): ESOPHAGOGASTRODUODENOSCOPY (EGD) (N/A) as a surgical intervention .  The patient's history has been reviewed, patient examined, no change in status, stable for surgery.  I have reviewed the patient's chart and labs.  Questions were answered to the patient's satisfaction.     Reeves ForthSteven Paul Armbruster

## 2016-05-12 NOTE — Anesthesia Postprocedure Evaluation (Signed)
Anesthesia Post Note  Patient: Carmen Williams  Procedure(s) Performed: Procedure(s) (LRB): ESOPHAGOGASTRODUODENOSCOPY (EGD) WITH PROPOFOL (N/A)  Patient location during evaluation: PACU Anesthesia Type: MAC Level of consciousness: awake and alert Pain management: pain level controlled Vital Signs Assessment: post-procedure vital signs reviewed and stable Respiratory status: spontaneous breathing, nonlabored ventilation, respiratory function stable and patient connected to nasal cannula oxygen Cardiovascular status: stable and blood pressure returned to baseline Anesthetic complications: no    Last Vitals:  Vitals:   05/12/16 0747 05/12/16 0911  BP: (!) 150/73 (!) 94/42  Pulse: 83 82  Resp: 19 (!) 22  Temp: 36.8 C     Last Pain:  Vitals:   05/12/16 0747  TempSrc: Oral                 Shivam Mestas S

## 2016-05-12 NOTE — Op Note (Addendum)
[facility] Patient Name: Carmen DibbleFay Khaimov Procedure Date: 05/12/2016 MRN: [id number] Attending MD: Willaim RayasSteven P. Armbruster MD, MD Date of Birth: 12/09/1950 CSN: [id number] Age: [id number] Admit Type: Inpatient Procedure:                Upper GI endoscopy Indications:              Suspected upper gastrointestinal bleeding Providers:                Viviann SpareSteven P. Armbruster MD, MD, Anthony Saraniel Madden, RN,                            Arlee Muslimhris Chandler Tech., Technician, Anastasio ChampionJanet Evans, CRNA Referring MD:              Medicines:                Monitored Anesthesia Care Complications:            No immediate complications. Estimated blood loss:                            Minimal. Estimated Blood Loss:     Estimated blood loss was minimal. Procedure:                Pre-Anesthesia Assessment:                           - Prior to the procedure, a History and Physical                            was performed, and patient medications and                            allergies were reviewed. The patient's tolerance of                            previous anesthesia was also reviewed. The risks                            and benefits of the procedure and the sedation                            options and risks were discussed with the patient.                            All questions were answered, and informed consent                            was obtained. Prior Anticoagulants: The patient has                            taken no previous anticoagulant or antiplatelet                            agents. ASA Grade Assessment: III - A patient with  severe systemic disease. After reviewing the risks                            and benefits, the patient was deemed in                            satisfactory condition to undergo the procedure.                           After obtaining informed consent, the endoscope was                            passed under direct vision. Throughout the                     procedure, the patient's blood pressure, pulse, and                            oxygen saturations were monitored continuously. The                            [id number]) scope was introduced through the                            mouth, and advanced to the second part of duodenum.                            The upper GI endoscopy was accomplished without                            difficulty. The patient tolerated the procedure                            well. Scope In: Scope Out: Findings:      Esophagogastric landmarks were identified: the Z-line was found at 33       cm, the gastroesophageal junction was found at 33 cm and the upper       extent of the gastric folds was found at 38 cm from the incisors.      A 5 cm hiatal hernia was present. No Cameron lesions were appreciated.      LA Grade B (one or more mucosal breaks greater than 5 mm, not extending       between the tops of two mucosal folds) esophagitis with no bleeding was       found. There was a diminutive adherent clot noted in this area which was       washed off and no other stigmata of bleeding was noted      The exam of the esophagus was otherwise normal.      Three non-bleeding superficial diminutive gastric ulcers with no       stigmata of bleeding were found in the gastric antrum. The largest       lesion was 3 mm in largest dimension. Mild gastritis was noted in the       antrum as well.      The exam of the stomach was otherwise normal.  The duodenal bulb and second portion of the duodenum were normal. Impression:               - Esophagogastric landmarks identified.                           - 5 cm hiatal hernia.                           - LA Grade B reflux esophagitis.                           - 3 small Non-bleeding gastric ulcers with no                            stigmata of bleeding. Mild gastritis                           - Normal duodenal bulb and second portion of the                             duodenum.                           Overall, suspect patient most likely had bleeding                            from esophagitis noted on this exam, less likely                            gastric ulcerations. Moderate Sedation:      No moderate sedation, case performed with MAC Recommendation:           - Return patient to hospital ward for ongoing care.                           - Liquid diet now and advance diet as tolerated.                           - If stable today can discharge this afternoon - no                            high risk stigmata for bleeding appreciated                           - Stop IV protonix drip                           - H pylori IgG serology, treat if positive                           - Start protonix 40mg  PO BID x 1 month, and then                            daily thereafter                           -  NO NSAIDs                           - Repeat upper endoscopy in 2-3 months for                            surveillance - ensure no underlying Barrett's                            esophagus and ensure healing of ulcers                           - Recommend colonoscopy for colon cancer screening                            if no prior colonoscopy has been done, can                            coordinate at same time as follow up EGD Procedure Code(s):        --- Professional ---                           [phone], Esophagogastroduodenoscopy, flexible,                            transoral; diagnostic, including collection of                            specimen(s) by brushing or washing, when performed                            (separate procedure) Diagnosis Code(s):        --- Professional ---                           K44.9, Diaphragmatic hernia without obstruction or                            gangrene                           K21.0, Gastro-esophageal reflux disease with                            esophagitis                            K25.9, Gastric ulcer, unspecified as acute or                            chronic, without hemorrhage or perforation CPT copyright 2016 [employer]. All rights reserved. The codes documented in this report are preliminary and upon coder review may  be revised to meet current compliance requirements. Viviann SpareSteven P. Armbruster MD, MD 05/12/2016 8:59:09 AM This report has been signed electronically. Number of Addenda: 0

## 2016-05-12 NOTE — Anesthesia Preprocedure Evaluation (Signed)
Anesthesia Evaluation  Patient identified by MRN, date of birth, ID band Patient awake    Reviewed: Allergy & Precautions, NPO status , Patient's Chart, lab work & pertinent test results  Airway Mallampati: II  TM Distance: >3 FB Neck ROM: Full    Dental no notable dental hx.    Pulmonary neg pulmonary ROS,    Pulmonary exam normal breath sounds clear to auscultation       Cardiovascular negative cardio ROS Normal cardiovascular exam Rhythm:Regular Rate:Normal     Neuro/Psych negative neurological ROS  negative psych ROS   GI/Hepatic Neg liver ROS, GERD  ,  Endo/Other  Morbid obesity  Renal/GU negative Renal ROS  negative genitourinary   Musculoskeletal negative musculoskeletal ROS (+)   Abdominal   Peds negative pediatric ROS (+)  Hematology negative hematology ROS (+)   Anesthesia Other Findings   Reproductive/Obstetrics negative OB ROS                             Anesthesia Physical Anesthesia Plan  ASA: III  Anesthesia Plan: MAC   Post-op Pain Management:    Induction: Intravenous  Airway Management Planned: Nasal Cannula  Additional Equipment:   Intra-op Plan:   Post-operative Plan:   Informed Consent: I have reviewed the patients History and Physical, chart, labs and discussed the procedure including the risks, benefits and alternatives for the proposed anesthesia with the patient or authorized representative who has indicated his/her understanding and acceptance.   Dental advisory given  Plan Discussed with: CRNA and Surgeon  Anesthesia Plan Comments:         Anesthesia Quick Evaluation

## 2016-05-12 NOTE — Discharge Summary (Addendum)
Physician Discharge Summary  Carmen KochFay D Maines ZOX:[id number] DOB: 11/01/1950 DOA: 05/11/2016  PCP: Agustina CaroliHICKS, KRISTIN D, MD  Admit date: 05/11/2016 Discharge date: 05/12/2016  Admitted From: home  Disposition:  home  Recommendations for Outpatient Follow-up:  1. Follow up with PCP in 2 weeks for repeat CBC  2. Please follow up on the following pending results:  H. Pylori IgG (treat if positive), anti-intrinsic factor, anti-parietal cell   Home Health:  none  Equipment/Devices:  None  Discharge Condition:  Stable, improved CODE STATUS:  full  Diet recommendation:  regular   Brief/Interim Summary:  Carmen Williams is a 65 year old female with GERD and chronic bronchitis who presented to [facility] ED with dark tarry stools for the last 5 days.  No nausea, vomiting, abdominal pain.  She had recently taken a Z-pack followed by Augmentin 875mg  BID dose. On the morning of admission, she felt pale, dizzy, lightheaded and weak.  She denied NSAID use. She takes Tums every night for her GERD.  In the ER, her hemoglobin was 6.5, her BUN was elevated and her occult stool was positive.  Gastroenterology, Dr. Adela LankArmbruster, performed EGD on 05/12/2016 which demonstrated LA Grade B esophagitis, 3 small non-bleeding gastric ulcer with no stigmata of bleeding, and mild gastritis.  Suspected that her bleeding was from her esophagitis.  She was started on protonix 40mg  po BID to continue for one month.  Recommend repeat EGD in 2-3 months to screen for Barrett's and ensure healing of ulcers.  She required transfusion of 2 units of PRBC.  Labs confirmed vitamin B12 deficiency and borderline iron deficiency.  Anti-parietal and anti-IF ab were sent.  She was given vitamin B12 injection and feraheme and started on once daily iron supplementation x 1 month.  Follow up with PCP in 1-2 weeks for repeat CBC to ensure hemoglobin continuing to improve.  Hgb on date of discharge was 8g/dl.    Discharge Diagnoses:  Principal Problem:    Acute blood loss anemia Active Problems:   GERD (gastroesophageal reflux disease)   bronchitis (HCC)   Vitamin B12 deficiency   Esophagitis, [place] grade B   Gastric ulcer  Acute blood loss anemia secondary to LA Grade B esophagitis with hemorrhage s/p EGD on 05/12/2016.    -  Transfused 2 units PRBC -  Hemoglobin stabilized and was 8 g/dl at discharge -  Recommended avoidance of NSAIDS -  Continue protonix 40mg  po BID through Dec 21, then change to protonix 40mg  once daily -  F/u H. Pylori ag  Borderline iron deficiency secondary to blood loss as above.  Ferritin 20.  -  Feraheme 510mg  IV once -  Ferrous sulfate once daily  Vitamin B12 deficiency, may be due to consumption from blood loss -  Anti-parietal, anti-IF -  Vitamin B12 1000mcg IM once on 11/21 -  If ab are positive, will need once monthly vitamin B12 injections -  If ab are negative, one month of oral vit B12 1000mcg daily  Chronic bronchitis (HCC)  - d/c augmentin -  F/u with PCP  Discharge Instructions  Discharge Instructions    Diet general    Complete by:  As directed    Discharge instructions    Complete by:  As directed    Please take protonix (pantoprazole) 40mg  twice daily for the next month.  On December 22nd, start taking pantoprazole once daily.  Have your primary care doctor follow up on the pending blood tests for H. Pylori and for pernicious anemia.  Take iron supplements and have your primary care doctor check your anemia in about a week.  You will need to schedule an appointment with Dr. Adela Lank in 2 months for repeat endoscopy and colonoscopy.  Return to the ER if you have lightheadedness, weakness, shortness of breath, and ongoing dark/black stools.   Increase activity slowly    Complete by:  As directed        Medication List    STOP taking these medications   amoxicillin-clavulanate 875-125 MG tablet Commonly known as:  AUGMENTIN     TAKE these medications   acetaminophen 500  MG tablet Commonly known as:  TYLENOL Take 1,000 mg by mouth every 6 (six) hours as needed (shoulder pain).   amitriptyline 10 MG tablet Commonly known as:  ELAVIL Take 10 mg by mouth at bedtime.   benzonatate 200 MG capsule Commonly known as:  TESSALON Take 200 mg by mouth 3 (three) times daily as needed for cough. X 7 days   ferrous sulfate 325 (65 FE) MG tablet Take 1 tablet (325 mg total) by mouth daily with breakfast.   pantoprazole 40 MG tablet Commonly known as:  PROTONIX Take 1 tablet (40 mg total) by mouth 2 (two) times daily.      Follow-up Information    HICKS, KRISTIN D, MD. Schedule an appointment as soon as possible for a visit in 10 day(s).   Specialty:  Internal Medicine Why:  follow up anemia       Ruffin Frederick, MD. Schedule an appointment as soon as possible for a visit in 2 month(s).   Specialty:  Gastroenterology Why:  schedule repeat upper endoscopy and colonoscopy Contact information: [address] [place] [address] [phone]          No Known Allergies  Consultations: [facility], Dr. Adela Lank   Procedures/Studies: Dg Chest 2 View  Result Date: 05/11/2016 CLINICAL DATA:  Dry cough for a week. EXAM: CHEST  2 VIEW COMPARISON:  None. FINDINGS: The heart size and mediastinal contours are within normal limits. Both lungs are clear. The visualized skeletal structures are unremarkable. IMPRESSION: No active cardiopulmonary disease. Electronically Signed   By: Charlett Nose M.D.   On: 05/11/2016 11:53   EGD on 05/12/2016   Subjective:  Feels much better.  Energy is better, no longer lightheaded.  Would like to go home now.  Denies abdominal pain.  Last BM was melena yesterday morning.    Discharge Exam: Vitals:   05/12/16 1100 05/12/16 1200  BP: (!) 84/38 (!) 106/50  Pulse: 82 75  Resp: (!) 21 (!) 23  Temp:  97.8 F (36.6 C)   Vitals:   05/12/16 0920 05/12/16 0930 05/12/16 1100 05/12/16 1200   BP: (!) 97/42 (!) 97/46 (!) 84/38 (!) 106/50  Pulse: 73 69 82 75  Resp: 17 12 (!) 21 (!) 23  Temp: 98 F (36.7 C)   97.8 F (36.6 C)  TempSrc: Oral   Oral  SpO2: 100% 100%    Weight:      Height:        General: Pt is alert, awake, not in acute distress Cardiovascular: RRR, S1/S2 +, no rubs, no gallops Respiratory: CTA bilaterally, no wheezing, no rhonchi Abdominal: Soft, NT, ND, bowel sounds + Extremities: no edema, no cyanosis    The results of significant diagnostics from this hospitalization (including imaging, microbiology, ancillary and laboratory) are listed below for reference.     Microbiology: Recent Results (from the past  240 hour(s))  MRSA PCR Screening     Status: None   Collection Time: 05/11/16  3:00 PM  Result Value Ref Range Status   MRSA by PCR NEGATIVE NEGATIVE Final    Comment:        The GeneXpert MRSA Assay (FDA approved for NASAL specimens only), is one component of a comprehensive MRSA colonization surveillance program. It is not intended to diagnose MRSA infection nor to guide or monitor treatment for MRSA infections.      Labs: BNP (last 3 results) No results for input(s): BNP in the last 8760 hours. Basic Metabolic Panel:  Recent Labs Lab 05/11/16 1122 05/12/16 0606  NA 137 139  K 3.6 3.6  CL 106 113*  CO2 22 23  GLUCOSE 135* 94  BUN 38* 16  CREATININE 0.83 0.75  CALCIUM 8.7* 7.8*   Liver Function Tests:  Recent Labs Lab 05/11/16 1122  AST 21  ALT 13*  ALKPHOS 72  BILITOT 0.3  PROT 6.6  ALBUMIN 3.5   No results for input(s): LIPASE, AMYLASE in the last 168 hours. No results for input(s): AMMONIA in the last 168 hours. CBC:  Recent Labs Lab 05/11/16 1122 05/11/16 1612 05/12/16 0606 05/12/16 1137  WBC 12.6*  --   --   --   NEUTROABS 10.1*  --   --   --   HGB 6.5* 6.3* 7.7* 8.0*  HCT 20.3* 19.0* 23.0* 24.6*  MCV 100.5*  --   --   --   PLT 285  --   --   --    Cardiac Enzymes:  Recent Labs Lab  05/11/16 1122  TROPONINI <0.03   BNP: Invalid input(s): POCBNP CBG: No results for input(s): GLUCAP in the last 168 hours. D-Dimer No results for input(s): DDIMER in the last 72 hours. Hgb A1c No results for input(s): HGBA1C in the last 72 hours. Lipid Profile No results for input(s): CHOL, HDL, LDLCALC, TRIG, CHOLHDL, LDLDIRECT in the last 72 hours. Thyroid function studies No results for input(s): TSH, T4TOTAL, T3FREE, THYROIDAB in the last 72 hours.  Invalid input(s): FREET3 Anemia work up  Recent Labs  05/11/16 1644 05/12/16 0606  VITAMINB12 163* 165*  FOLATE 14.3  --   FERRITIN 20  --   TIBC 276  --   IRON 29  --   RETICCTPCT 7.8*  --    Urinalysis No results found for: COLORURINE, APPEARANCEUR, LABSPEC, PHURINE, GLUCOSEU, HGBUR, BILIRUBINUR, KETONESUR, PROTEINUR, UROBILINOGEN, NITRITE, LEUKOCYTESUR Sepsis Labs Invalid input(s): PROCALCITONIN,  WBC,  LACTICIDVEN   Time coordinating discharge: Over 30 minutes  SIGNED:   Renae FickleSHORT, Kyrielle Urbanski, MD  [facility] 05/12/2016, 1:06 PM Pager   If 7PM-7AM, please contact night-coverage [email or web address] Password [email or web address]

## 2016-05-12 NOTE — Transfer of Care (Signed)
Immediate Anesthesia Transfer of Care Note  Patient: Hoyt KochFay D Virgil  Procedure(s) Performed: Procedure(s): ESOPHAGOGASTRODUODENOSCOPY (EGD) WITH PROPOFOL (N/A)  Patient Location: PACU  Anesthesia Type:MAC  Level of Consciousness: awake, alert , oriented and patient cooperative  Airway & Oxygen Therapy: Patient Spontanous Breathing and Patient connected to face mask oxygen  Post-op Assessment: Report given to RN, Post -op Vital signs reviewed and stable and Patient moving all extremities X 4  Post vital signs: stable  Last Vitals:  Vitals:   05/12/16 0747 05/12/16 0911  BP: (!) 150/73 (!) 94/42  Pulse: 83 82  Resp: 19 (!) 22  Temp: 36.8 C     Last Pain:  Vitals:   05/12/16 0747  TempSrc: Oral         Complications: No apparent anesthesia complications

## 2016-05-13 ENCOUNTER — Encounter (HOSPITAL_COMMUNITY): Payer: Self-pay | Admitting: Gastroenterology

## 2016-05-13 LAB — TYPE AND SCREEN
ABO/RH(D): O NEG
ANTIBODY SCREEN: NEGATIVE
UNIT DIVISION: 0
Unit division: 0

## 2016-05-13 LAB — ANTI-PARIETAL ANTIBODY: Parietal Cell Antibody-IgG: 3.7 Units (ref 0.0–20.0)

## 2016-05-13 LAB — INTRINSIC FACTOR ANTIBODIES: Intrinsic Factor: 1 AU/mL (ref 0.0–1.1)

## 2016-05-13 LAB — H. PYLORI ANTIBODY, IGG: H Pylori IgG: <0.9 U/mL (ref 0.0–0.8)

## 2016-05-25 ENCOUNTER — Encounter: Payer: Self-pay | Admitting: Gastroenterology

## 2017-04-07 IMAGING — CR DG CHEST 2V
2 series · 2 of 2 positions shown · non-contrast
Comparison: None.

CLINICAL DATA: Dry cough for a week.

EXAM:
CHEST  2 VIEW

[w chest pa]
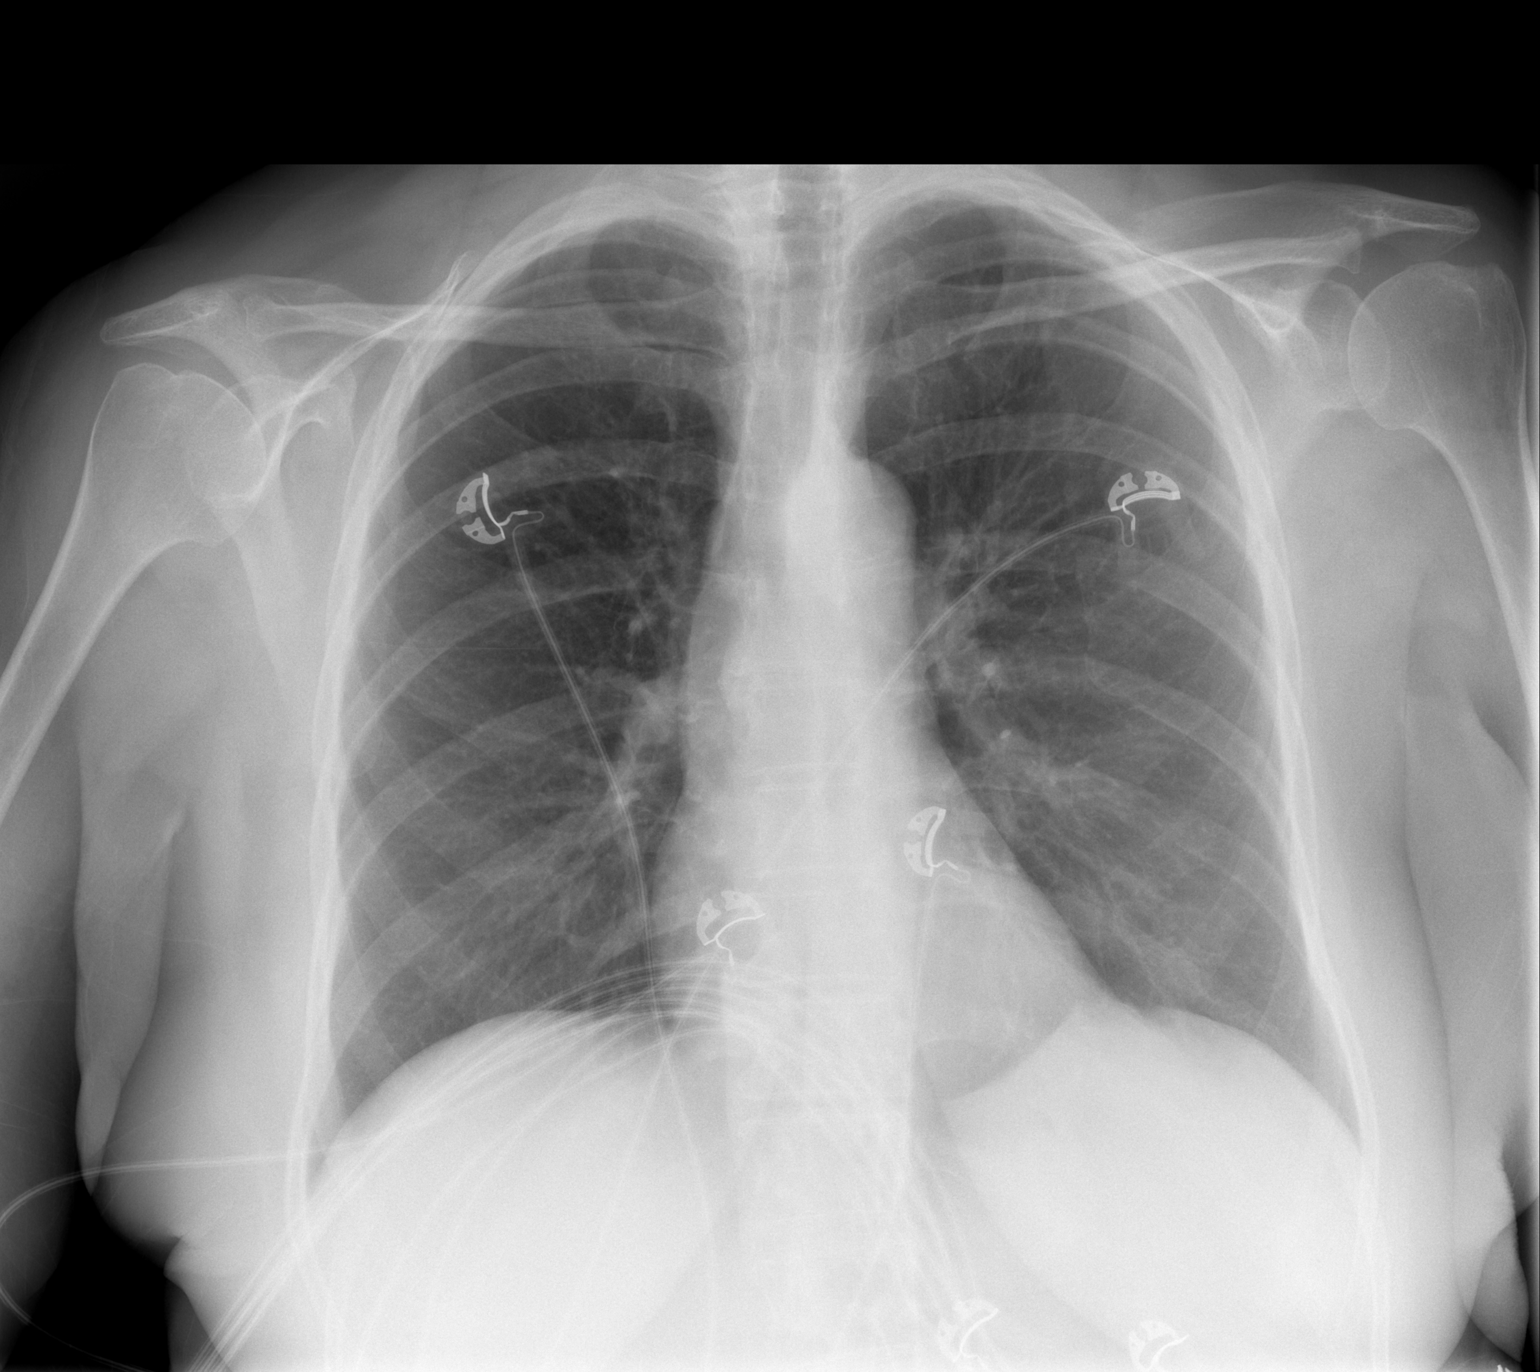

[w chest lat]
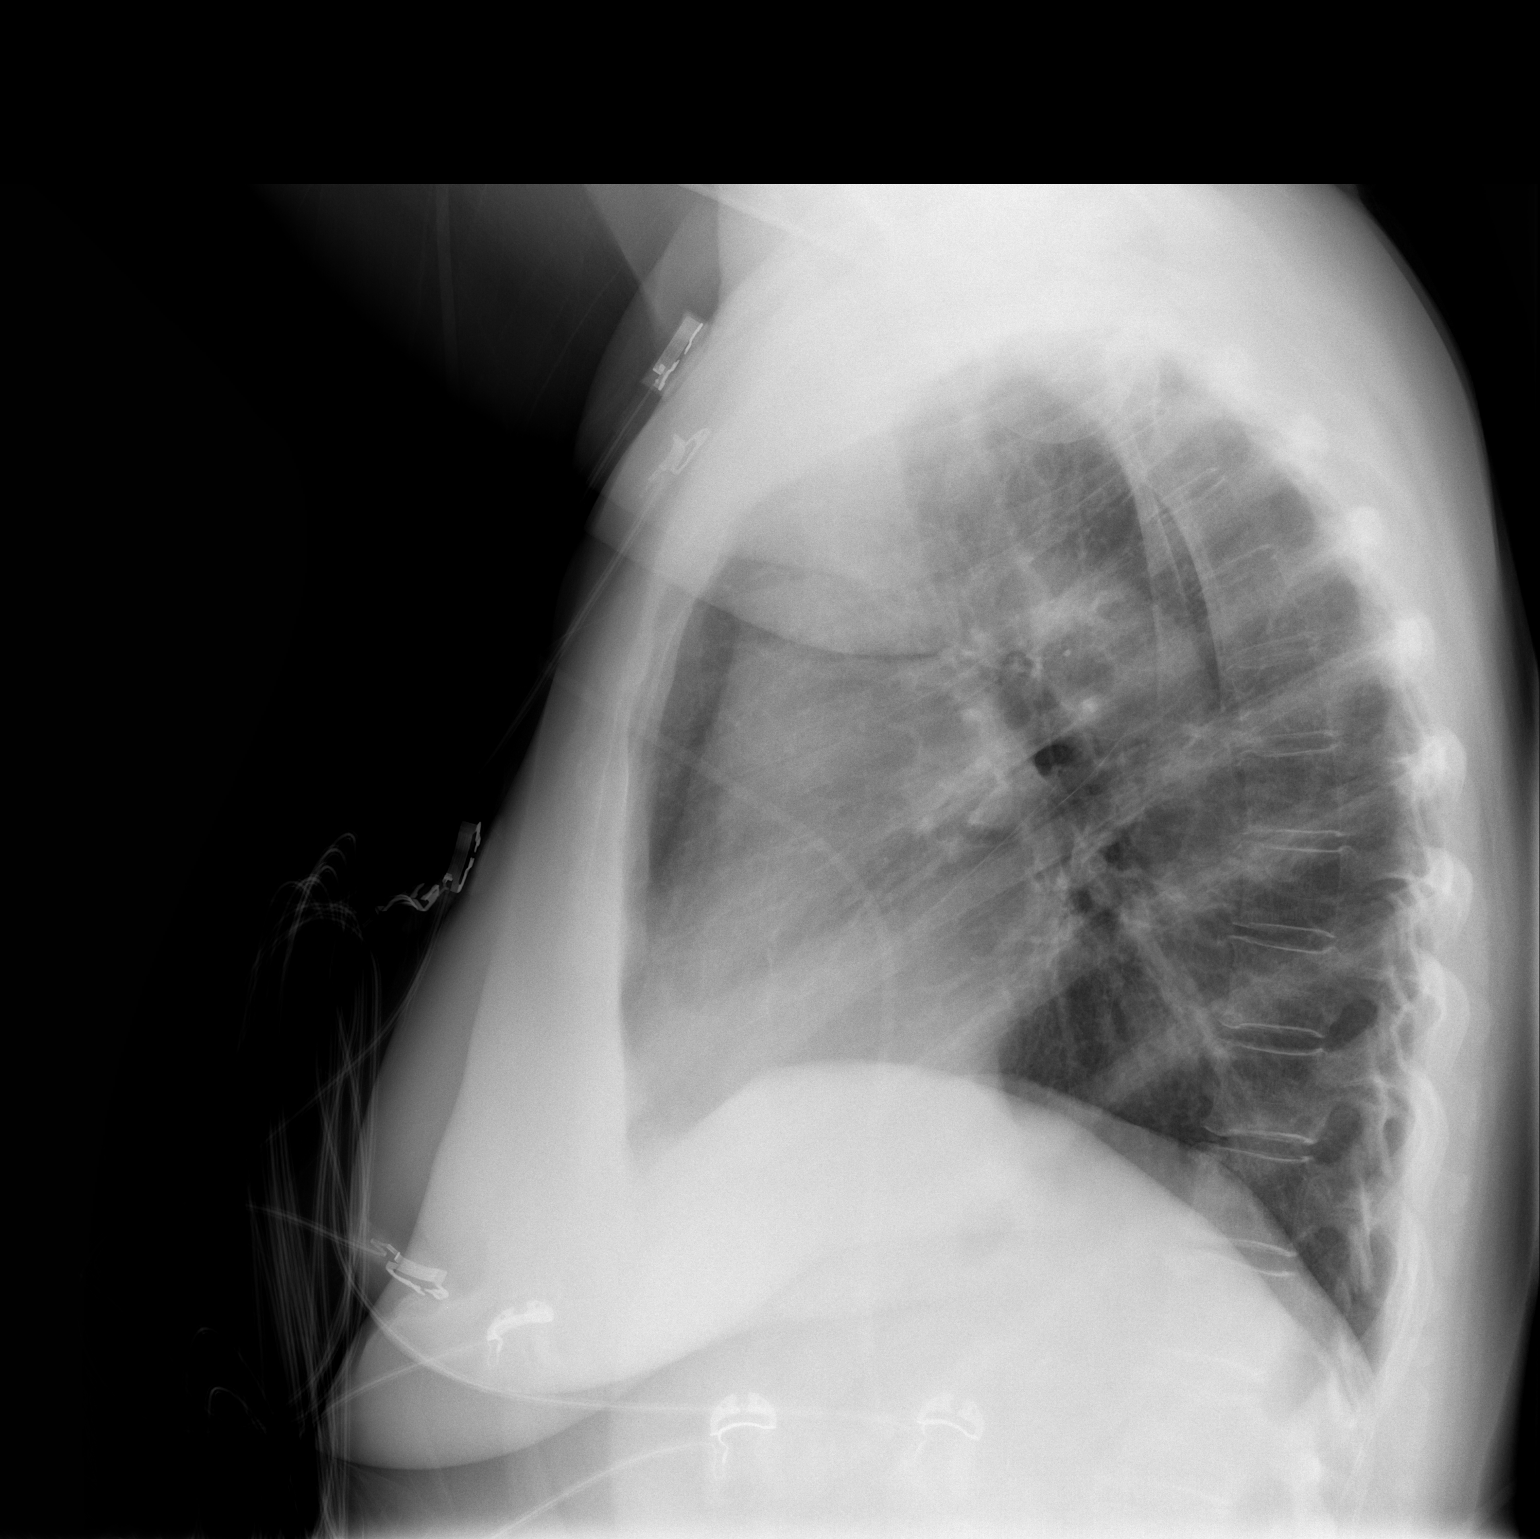

[2 of 2 positions shown; findings below may reference images not displayed]

FINDINGS: The heart size and mediastinal contours are within normal limits.
Both lungs are clear. The visualized skeletal structures are
unremarkable.
IMPRESSION: No active cardiopulmonary disease.

## 2022-08-27 ENCOUNTER — Encounter (HOSPITAL_BASED_OUTPATIENT_CLINIC_OR_DEPARTMENT_OTHER): Payer: Self-pay | Admitting: Emergency Medicine

## 2022-08-27 ENCOUNTER — Emergency Department (HOSPITAL_BASED_OUTPATIENT_CLINIC_OR_DEPARTMENT_OTHER): Payer: Medicare Other

## 2022-08-27 ENCOUNTER — Emergency Department (HOSPITAL_BASED_OUTPATIENT_CLINIC_OR_DEPARTMENT_OTHER)
Admission: EM | Admit: 2022-08-27 | Discharge: 2022-08-27 | Disposition: A | Discharge status: Home / Self Care | Payer: Medicare Other | Attending: Emergency Medicine | Admitting: Emergency Medicine

## 2022-08-27 DIAGNOSIS — M25532 Pain in left wrist: Secondary | ICD-10-CM | POA: Insufficient documentation

## 2022-08-27 MED ORDER — OXYCODONE-ACETAMINOPHEN 5-325 MG PO TABS: 1.0000 | ORAL_TABLET | Freq: Two times a day (BID) | ORAL | 0 refills | Status: AC | PRN

## 2022-08-27 MED ORDER — NAPROXEN 250 MG PO TABS
250.0000 mg | ORAL_TABLET | Freq: Once | ORAL | Status: AC
  Administered 2022-08-27: 250 mg via ORAL
  Filled 2022-08-27: qty 1

## 2022-08-27 MED ORDER — ACETAMINOPHEN 325 MG PO TABS
650.0000 mg | ORAL_TABLET | Freq: Once | ORAL | Status: AC
  Administered 2022-08-27: 650 mg via ORAL
  Filled 2022-08-27: qty 2

## 2022-08-27 MED ORDER — IBUPROFEN 400 MG PO TABS: 400.0000 mg | ORAL_TABLET | Freq: Three times a day (TID) | ORAL | 0 refills | Status: AC

## 2022-08-27 MED ORDER — ACETAMINOPHEN 500 MG PO TABS: 500.0000 mg | ORAL_TABLET | Freq: Four times a day (QID) | ORAL | 0 refills | Status: AC | PRN

## 2022-08-27 NOTE — Discharge Instructions (Addendum)
Take the medicines prescribed. Call the Orthopedist for close follow up.  Take the narcotic pain medicine only if the pain is excruciating despite regular medications.

## 2022-08-27 NOTE — ED Triage Notes (Signed)
Pt states yesterday morning she woke up and her left hand was tingling like she slept on it wrong  Pt states last night her hand started throbbing and it has not stopped  Pt states she took tylenol without relief  Denies injury

## 2022-08-27 NOTE — ED Notes (Signed)
Pt sitting up in bed, daughter at bedside, pt verbalized understanding d/c and follow up, brace in place, pt ambulatory from dpt.

## 2022-08-27 NOTE — ED Provider Triage Note (Signed)
Emergency Medicine Provider Triage Evaluation Note  Carmen Williams , a 71 y.o. female  was evaluated in triage.  Pt complains of L hand/wrist pain, started 24 hours ago, woke up feeling like she'd slept on it wrong. Aching all day, worse with morning. No known injury. No history of similar. No numbness/tingling. Does not radiate proximally.   Review of Systems  Positive:  Negative:   Physical Exam  BP (!) 171/106 (BP Location: Right Arm)   Pulse (!) 107   Temp 98 F (36.7 C) (Oral)   Resp 16   Ht '5\' 2"'$  (1.575 m)   Wt 81.6 kg   SpO2 100%   BMI 32.92 kg/m  Gen:   Awake, no distress   Resp:  Normal effort  MSK:   Pain with passive ROM of L wrist and hand Other:   Medical Decision Making  Medically screening exam initiated at 6:43 AM.  Appropriate orders placed.  Noreene Filbert was informed that the remainder of the evaluation will be completed by another provider, this initial triage assessment does not replace that evaluation, and the importance of remaining in the ED until their evaluation is complete.     Truddie Hidden, MD 08/27/22 [phone]

## 2022-08-27 NOTE — ED Provider Notes (Signed)
[facility] Provider Note   CSN: [id number] Arrival date & time: 08/27/22  [id number]     History  Chief Complaint  Patient presents with   Hand Pain    Carmen Williams is a 72 y.o. female.  HPI   72 year old female comes in with chief complaint of hand pain. Patient states that she started having left-sided hand/wrist pain yesterday.  Pain is primarily close to the wrist.  She is able to move it, but it is painful.  Patient denies any trauma.  No previous injury to that wrist.  Patient is right-handed dominant.  She denies any excessive use of left wrist.  Patient denies any numbness or tingling at this time.  She states that she has her fingers contracted, as it is more painful when the fingers are stretched.   Home Medications Prior to Admission medications   Medication Sig Start Date End Date Taking? Authorizing Provider  acetaminophen (TYLENOL) 500 MG tablet Take 1 tablet (500 mg total) by mouth every 6 (six) hours as needed. 08/27/22  Yes Varney Biles, MD  ibuprofen (ADVIL) 400 MG tablet Take 1 tablet (400 mg total) by mouth 3 (three) times daily. 08/27/22  Yes Varney Biles, MD  oxyCODONE-acetaminophen (PERCOCET/ROXICET) 5-325 MG tablet Take 1 tablet by mouth every 12 (twelve) hours as needed for severe pain. 08/27/22  Yes Varney Biles, MD  amitriptyline (ELAVIL) 10 MG tablet Take 10 mg by mouth at bedtime. 03/04/16 03/04/17  [provider]  ferrous sulfate 325 (65 FE) MG tablet Take 1 tablet (325 mg total) by mouth daily with breakfast. 05/12/16   Janece Canterbury, MD  pantoprazole (PROTONIX) 40 MG tablet Take 1 tablet (40 mg total) by mouth 2 (two) times daily. 05/12/16   Janece Canterbury, MD      Allergies    Patient has no known allergies.    Review of Systems   Review of Systems  All other systems reviewed and are negative.   Physical Exam Updated Vital Signs BP (!) 151/88 (BP Location: Right Arm)   Pulse 86   Temp  (!) 97.4 F (36.3 C) (Oral)   Resp 17   Ht '5\' 2"'$  (1.575 m)   Wt 81.6 kg   SpO2 100%   BMI 32.92 kg/m  Physical Exam Vitals and nursing note reviewed.  Constitutional:      Appearance: She is well-developed.  HENT:     Head: Atraumatic.  Cardiovascular:     Rate and Rhythm: Normal rate.  Pulmonary:     Effort: Pulmonary effort is normal.  Musculoskeletal:     Cervical back: Normal range of motion and neck supple.     Comments: Patient able to tolerate passive flexion extension of the wrist.  She is also able to actively flex and extend the wrist, some of it with no discomfort, other with tolerable discomfort.  There is no excessive edema of the wrist itself.  Patient has tenderness around the wrist diffusely, but she has significant discomfort at the anatomical snuffbox and scaphoid bone.  No erythema, no warmth to touch.  Patient able to fire the intrinsic muscles of the fingers and thumb by making fist, extending her fingers abducting and abducting her digits  Skin:    General: Skin is warm and dry.  Neurological:     Mental Status: She is alert and oriented to person, place, and time.     ED Results / Procedures / Treatments   Labs (all  labs ordered are listed, but only abnormal results are displayed) Labs Reviewed - No data to display  EKG None  Radiology DG Wrist Complete Left  Result Date: 08/27/2022 CLINICAL DATA:  Pain EXAM: LEFT WRIST - 4 VIEW; LEFT HAND - 3 VIEW COMPARISON:  None Available. FINDINGS: No acute fracture, dislocation or subluxation. Sclerosis at the base of the first metacarpal with osteophyte formation consistent with osteoarthritis. Calcification in the region of the triangular fibrocartilage. Widened trapezium scaphoid interval. Ligamentous injury could be present. IMPRESSION: 1. No acute traumatic abnormalities are identified. 2. 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 4. Widened trapezium scaphoid interval of uncertain  etiology. An MRI of the wrist might be helpful for further evaluation for internal derangement. Electronically Signed   By: Sammie Bench M.D.   On: 08/27/2022 07:09   DG Hand Complete Left  Result Date: 08/27/2022 CLINICAL DATA:  Pain EXAM: LEFT WRIST - 4 VIEW; LEFT HAND - 3 VIEW COMPARISON:  None Available. FINDINGS: No acute fracture, dislocation or subluxation. Sclerosis at the base of the first metacarpal with osteophyte formation consistent with osteoarthritis. Calcification in the region of the triangular fibrocartilage. Widened trapezium scaphoid interval. Ligamentous injury could be present. IMPRESSION: 1. No acute traumatic abnormalities are identified. 2. 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 4. Widened trapezium scaphoid interval of uncertain etiology. An MRI of the wrist might be helpful for further evaluation for internal derangement. Electronically Signed   By: Sammie Bench M.D.   On: 08/27/2022 07:09    Procedures Procedures    Medications Ordered in ED Medications  naproxen (NAPROSYN) tablet 250 mg (250 mg Oral Given 08/27/22 0749)  acetaminophen (TYLENOL) tablet 650 mg (650 mg Oral Given 08/27/22 0749)    ED Course/ Medical Decision Making/ A&P                             Medical Decision Making Risk OTC drugs. Prescription drug management.  72 year old female is in with chief complaint of wrist pain.  There is no trauma.  Patient has no significant past medical history.  No history of gout,.  Patient is not immunocompetent or immunosuppressed.  On exam, she does not have significant wrist swelling and able to tolerate passive range of motion of the wrist.  X-ray of the wrist were ordered, and I have independently interpreted them.  There is clear evidence of widening around the scaphoid bone and she has point tenderness in the anatomic snuffbox region.  Differential diagnosis considered for this patient includes septic arthritis, gouty  arthritis, osteoarthritis with exacerbation, autoimmune arthritis, neuropathic pain secondary to inflammation of the neurovascular bundles/nerve sheath and nerve impingement.  Based on exam, infectious etiology considered to be less likely. Plan is to place patient in a wrist Velcro splint with thumb nd advised RICE treatment with icing 4 times a day and following up with the orthopedic hand surgeon within 3 to 5 days.  I have also discussed return precautions with her.  She will return to the ER if her pain gets worse.  For her nighttime symptoms we will give her Percocet, otherwise she will take NSAID and Tylenol only.  I have reviewed patient's discharge summary from 2017 and also all her regular meds to ensure that she is not immunocompromised or immunosuppressed.   Final Clinical Impression(s) / ED Diagnoses Final diagnoses:  Acute pain of left wrist    Rx / DC Orders ED Discharge  Orders          Ordered    ibuprofen (ADVIL) 400 MG tablet  3 times daily        08/27/22 0742    acetaminophen (TYLENOL) 500 MG tablet  Every 6 hours PRN        08/27/22 0742    oxyCODONE-acetaminophen (PERCOCET/ROXICET) 5-325 MG tablet  Every 12 hours PRN        08/27/22 0745              Varney Biles, MD 08/27/22 [phone]

## 2023-01-19 ENCOUNTER — Other Ambulatory Visit: Payer: Self-pay

## 2023-01-19 ENCOUNTER — Emergency Department (HOSPITAL_BASED_OUTPATIENT_CLINIC_OR_DEPARTMENT_OTHER)
Admission: EM | Admit: 2023-01-19 | Discharge: 2023-01-20 | Disposition: A | Discharge status: Home / Self Care | Payer: Medicare Other | Attending: Emergency Medicine | Admitting: Emergency Medicine

## 2023-01-19 ENCOUNTER — Encounter (HOSPITAL_BASED_OUTPATIENT_CLINIC_OR_DEPARTMENT_OTHER): Payer: Self-pay

## 2023-01-19 DIAGNOSIS — R42 Dizziness and giddiness: Secondary | ICD-10-CM | POA: Insufficient documentation

## 2023-01-19 DIAGNOSIS — N179 Acute kidney failure, unspecified: Secondary | ICD-10-CM | POA: Insufficient documentation

## 2023-01-19 DIAGNOSIS — E860 Dehydration: Secondary | ICD-10-CM | POA: Diagnosis not present

## 2023-01-19 LAB — CBC
HCT: 34 % — ABNORMAL LOW (ref 36.0–46.0)
Hemoglobin: 11 g/dL — ABNORMAL LOW (ref 12.0–15.0)
MCH: 28.9 pg (ref 26.0–34.0)
MCHC: 32.4 g/dL (ref 30.0–36.0)
MCV: 89.5 fL (ref 80.0–100.0)
Platelets: 392 10*3/uL (ref 150–400)
RBC: 3.8 MIL/uL — ABNORMAL LOW (ref 3.87–5.11)
RDW: 15.5 % (ref 11.5–15.5)
WBC: 9.7 10*3/uL (ref 4.0–10.5)
nRBC: 0 % (ref 0.0–0.2)

## 2023-01-19 LAB — BASIC METABOLIC PANEL
Anion gap: 12 (ref 5–15)
BUN: 32 mg/dL — ABNORMAL HIGH (ref 8–23)
CO2: 22 mmol/L (ref 22–32)
Calcium: 10.2 mg/dL (ref 8.9–10.3)
Chloride: 100 mmol/L (ref 98–111)
Creatinine, Ser: 2 mg/dL — ABNORMAL HIGH (ref 0.44–1.00)
GFR, Estimated: 26 mL/min — ABNORMAL LOW (ref >60)
Glucose, Bld: 108 mg/dL — ABNORMAL HIGH (ref 70–99)
Potassium: 3.7 mmol/L (ref 3.5–5.1)
Sodium: 134 mmol/L — ABNORMAL LOW (ref 135–145)

## 2023-01-19 LAB — URINALYSIS, ROUTINE W REFLEX MICROSCOPIC
Bilirubin Urine: NEGATIVE
Glucose, UA: NEGATIVE mg/dL
Ketones, ur: NEGATIVE mg/dL
Leukocytes,Ua: NEGATIVE
Nitrite: NEGATIVE
Protein, ur: 30 mg/dL — AB
Specific Gravity, Urine: 1.02 (ref 1.005–1.030)
pH: 6.5 (ref 5.0–8.0)

## 2023-01-19 LAB — URINALYSIS, MICROSCOPIC (REFLEX)

## 2023-01-19 LAB — CBG MONITORING, ED: Glucose-Capillary: 104 mg/dL — ABNORMAL HIGH (ref 70–99)

## 2023-01-19 LAB — TROPONIN I (HIGH SENSITIVITY)
Troponin I (High Sensitivity): 8 ng/L (ref <18)
Troponin I (High Sensitivity): 8 ng/L (ref <18)

## 2023-01-19 MED ORDER — CALCIUM CARBONATE ANTACID 500 MG PO CHEW
2.0000 | CHEWABLE_TABLET | Freq: Once | ORAL | Status: AC
  Administered 2023-01-19: 400 mg via ORAL
  Filled 2023-01-19: qty 2

## 2023-01-19 MED ORDER — SODIUM CHLORIDE 0.9 % IV BOLUS
1000.0000 mL | Freq: Once | INTRAVENOUS | Status: AC
  Administered 2023-01-19: 1000 mL via INTRAVENOUS

## 2023-01-19 MED ORDER — METOCLOPRAMIDE HCL 5 MG/ML IJ SOLN
5.0000 mg | Freq: Once | INTRAMUSCULAR | Status: AC
  Administered 2023-01-19: 5 mg via INTRAVENOUS
  Filled 2023-01-19: qty 2

## 2023-01-19 NOTE — Discharge Instructions (Addendum)
Eat a healthy meal.  Avoid excess sugar beverages.

## 2023-01-19 NOTE — ED Triage Notes (Signed)
Pt reports her doctor told her to lose weight and cut her food in half; pt  family states that she is just not eating at all and taking her diet to the extreme. Pt was [place] a [employer] game and began to feel dizzy there. Pt has had these sx for 24 hours. EMT at game states CBG was normal, but she was hypotensive. Pt BP soft in triage (91/68). Pt feeling nauseous and has had some diarrhea. No fevers.

## 2023-01-19 NOTE — ED Provider Notes (Signed)
EMERGENCY DEPARTMENT AT [facility] Provider Note   CSN: [id number] Arrival date & time: 01/19/23  1951     History  Chief Complaint  Patient presents with   Dizziness    Carmen Williams is a 72 y.o. female.  Patient complains of feeling dizzy tonight.  Patient was at a [employer] game and EMT took her blood pressure which was low.  Patient has a past medical history of high blood pressure and takes blood pressure medicine.  Patient reports she recently saw her primary care physician who advised her to lose weight and to cut her portions in half.  Patient reports that she has had decreased appetite patient states she has been drinking 64 ounces of water a day.  Patient's family member reports patient has been drinking water with skittles flavoring.  Flavoring has sugar.  States that she only ate 1 piece of chicken yesterday and ate a piece of tomato today.  Patient reports she has lost 6 pounds in 1 week  The history is provided by the patient and a relative. No language interpreter was used.  Dizziness Severity:  Moderate Onset quality:  Gradual Timing:  Constant Progression:  Worsening Chronicity:  New Relieved by:  Nothing Worsened by:  Nothing Ineffective treatments:  None tried Associated symptoms: no nausea        Home Medications Prior to Admission medications   Medication Sig Start Date End Date Taking? Authorizing Provider  acetaminophen (TYLENOL) 500 MG tablet Take 1 tablet (500 mg total) by mouth every 6 (six) hours as needed. 08/27/22   Derwood Kaplan, MD  amitriptyline (ELAVIL) 10 MG tablet Take 10 mg by mouth at bedtime. 03/04/16 03/04/17  [provider]  ferrous sulfate 325 (65 FE) MG tablet Take 1 tablet (325 mg total) by mouth daily with breakfast. 05/12/16   Renae Fickle, MD  ibuprofen (ADVIL) 400 MG tablet Take 1 tablet (400 mg total) by mouth 3 (three) times daily. 08/27/22   Derwood Kaplan, MD  oxyCODONE-acetaminophen  (PERCOCET/ROXICET) 5-325 MG tablet Take 1 tablet by mouth every 12 (twelve) hours as needed for severe pain. 08/27/22   Derwood Kaplan, MD  pantoprazole (PROTONIX) 40 MG tablet Take 1 tablet (40 mg total) by mouth 2 (two) times daily. 05/12/16   Renae Fickle, MD      Allergies    Patient has no known allergies.    Review of Systems   Review of Systems  Gastrointestinal:  Negative for nausea.  Neurological:  Positive for dizziness.  All other systems reviewed and are negative.   Physical Exam Updated Vital Signs BP 138/80   Pulse 79   Temp 98.4 F (36.9 C) (Oral)   Resp 12   Ht 5\' 2"  (1.575 m)   Wt 83.9 kg   SpO2 98%   BMI 33.84 kg/m  Physical Exam Vitals and nursing note reviewed.  Constitutional:      Appearance: She is well-developed.  HENT:     Head: Normocephalic.     Mouth/Throat:     Mouth: Mucous membranes are moist.  Eyes:     Pupils: Pupils are equal, round, and reactive to light.  Cardiovascular:     Rate and Rhythm: Normal rate.  Pulmonary:     Effort: Pulmonary effort is normal.  Abdominal:     General: There is no distension.  Musculoskeletal:        General: Normal range of motion.     Cervical back: Normal range of motion.  Neurological:     General: No focal deficit present.     Mental Status: She is alert and oriented to person, place, and time.  Psychiatric:        Mood and Affect: Mood normal.     ED Results / Procedures / Treatments   Labs (all labs ordered are listed, but only abnormal results are displayed) Labs Reviewed  BASIC METABOLIC PANEL - Abnormal; Notable for the following components:      Result Value   Sodium 134 (*)    Glucose, Bld 108 (*)    BUN 32 (*)    Creatinine, Ser 2.00 (*)    GFR, Estimated 26 (*)    All other components within normal limits  CBC - Abnormal; Notable for the following components:   RBC 3.80 (*)    Hemoglobin 11.0 (*)    HCT 34.0 (*)    All other components within normal limits   URINALYSIS, ROUTINE W REFLEX MICROSCOPIC - Abnormal; Notable for the following components:   APPearance HAZY (*)    Hgb urine dipstick TRACE (*)    Protein, ur 30 (*)    All other components within normal limits  URINALYSIS, MICROSCOPIC (REFLEX) - Abnormal; Notable for the following components:   Bacteria, UA RARE (*)    All other components within normal limits  CBG MONITORING, ED - Abnormal; Notable for the following components:   Glucose-Capillary 104 (*)    All other components within normal limits  BASIC METABOLIC PANEL  TROPONIN I (HIGH SENSITIVITY)  TROPONIN I (HIGH SENSITIVITY)    EKG EKG Interpretation Date/Time:  Tuesday January 19 2023 20:13:53 EDT Ventricular Rate:  85 PR Interval:  171 QRS Duration:  88 QT Interval:  356 QTC Calculation: 424 R Axis:   -48  Text Interpretation: Sinus rhythm Left anterior fascicular block Abnormal R-wave progression, late transition Left ventricular hypertrophy Confirmed by Alvino Blood ([id number]) on 01/19/2023 9:52:16 PM  Radiology No results found.  Procedures Procedures    Medications Ordered in ED Medications  calcium carbonate (TUMS - dosed in mg elemental calcium) chewable tablet 400 mg of elemental calcium (400 mg of elemental calcium Oral Given 01/19/23 2113)  sodium chloride 0.9 % bolus 1,000 mL (0 mLs Intravenous Stopped 01/19/23 2243)  metoCLOPramide (REGLAN) injection 5 mg (5 mg Intravenous Given 01/19/23 2114)  sodium chloride 0.9 % bolus 1,000 mL (1,000 mLs Intravenous New Bag/Given 01/19/23 2245)    ED Course/ Medical Decision Making/ A&P                                 Medical Decision Making Patient has been dieting for the past week.  Patient reports she has lost 6 pounds in a week's time.  Patient reports that she is not eating.  Amount and/or Complexity of Data Reviewed Independent Historian:     Details: Patient is here with family who is supportive.  They have significant some concerns about patient's  diet Labs: ordered. Decision-making details documented in ED Course.    Details: Patient has an elevated BUN and creatinine.  Creatinine is 2.0 BUN is 32   Risk OTC drugs. Prescription drug management. Risk Details: Patient's labs are reviewed.  Patient's last viewable BUN and creatinine are 2 years ago.  Pt given IV fluids x 2 L BUN and creatinine are rechecked and are improved patient reports his dizziness has completely resolved.  I counseled the patient on healthy  eating.  I have advised her to see her primary care physician for recheck labs and further evaluation in 2 days patient is advised to return to the emergency department if any problems           Final Clinical Impression(s) / ED Diagnoses Final diagnoses:  Dizziness  Dehydration  Acute kidney injury (HCC)    Rx / DC Orders ED Discharge Orders     None     An After Visit Summary was printed and given to the patient.     Elson Areas, PA-C 01/20/23 0018    Lonell Grandchild, MD 01/20/23 [phone]
